# Patient Record
Sex: Male | Born: 1965 | Race: Black or African American | Hispanic: Yes | Marital: Single | Smoking: Never smoker
Health system: Southern US, Community
[De-identification: ages and names within clinical notes are randomized; demographics above are authoritative.]

## PROBLEM LIST (undated history)

## (undated) DIAGNOSIS — E119 Type 2 diabetes mellitus without complications: Secondary | ICD-10-CM

---

## 2013-04-04 ENCOUNTER — Emergency Department (HOSPITAL_COMMUNITY): Payer: No Typology Code available for payment source

## 2013-04-04 ENCOUNTER — Encounter (HOSPITAL_COMMUNITY): Payer: Self-pay | Admitting: Emergency Medicine

## 2013-04-04 ENCOUNTER — Emergency Department (HOSPITAL_COMMUNITY)
Admission: EM | Admit: 2013-04-04 | Discharge: 2013-04-04 | Disposition: A | Discharge status: Home / Self Care | Payer: No Typology Code available for payment source | Attending: Emergency Medicine | Admitting: Emergency Medicine

## 2013-04-04 DIAGNOSIS — Y9389 Activity, other specified: Secondary | ICD-10-CM | POA: Insufficient documentation

## 2013-04-04 DIAGNOSIS — F410 Panic disorder [episodic paroxysmal anxiety] without agoraphobia: Secondary | ICD-10-CM | POA: Insufficient documentation

## 2013-04-04 DIAGNOSIS — IMO0002 Reserved for concepts with insufficient information to code with codable children: Secondary | ICD-10-CM | POA: Insufficient documentation

## 2013-04-04 DIAGNOSIS — R0602 Shortness of breath: Secondary | ICD-10-CM | POA: Insufficient documentation

## 2013-04-04 DIAGNOSIS — M5410 Radiculopathy, site unspecified: Secondary | ICD-10-CM

## 2013-04-04 DIAGNOSIS — G479 Sleep disorder, unspecified: Secondary | ICD-10-CM | POA: Insufficient documentation

## 2013-04-04 DIAGNOSIS — Y92410 Unspecified street and highway as the place of occurrence of the external cause: Secondary | ICD-10-CM | POA: Insufficient documentation

## 2013-04-04 MED ORDER — METHOCARBAMOL 500 MG PO TABS: 500.0000 mg | ORAL_TABLET | Freq: Two times a day (BID) | ORAL | Status: DC

## 2013-04-04 MED ORDER — OXYCODONE-ACETAMINOPHEN 5-325 MG PO TABS: 2.0000 | ORAL_TABLET | ORAL | Status: DC | PRN

## 2013-04-04 MED ORDER — IBUPROFEN 800 MG PO TABS: 800.0000 mg | ORAL_TABLET | Freq: Three times a day (TID) | ORAL | Status: DC

## 2013-04-04 MED ORDER — PREDNISONE 20 MG PO TABS: ORAL_TABLET | ORAL | Status: DC
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
## 2013-04-04 NOTE — Discharge Instructions (Signed)
Radiculopata lumbosacra (Lumbosacral Radiculopathy) Le han diagnosticado una radiculopata lumbosacra. Significa que un nervio est comprimido en la zona inferior de la espalda (rea lumbosacra). Cuando esto ocurre, puede sentir debilidad en las piernas y ser incapaz de mantenerse parado en puntas de pie. Puede sentir Chief Strategy Officer corre por las piernas [place]. Puede tener dificultad para caminar normalmente. Hay numerosas causas que originan este problema. Algunas veces es consecuencia de una lesin o simplemente es debido a artritis o problemas seos. Otra de las causas puede ser una enfermedad, como la diabetes. Si no mejora luego del tratamiento, deber realizarse estudios ms profundos para hallar la causa exacta. DIAGNSTICO Puede ser necesario que le tomen radiografas si los problemas persisten durante mucho tiempo. Podrn realizarle una electromiografa. En este examen se estudia el funcionamiento de los nervios y los msculos. INSTRUCCIONES PARA EL CUIDADO DOMICILIARIO  Puede aliviarlo la aplicacin de una bolsa con hielo. Coloque el hielo en una bolsa plstica y envulvala en una toalla para prevenir el congelamiento de la piel. Podr aplicarlo cada 2 horas durante 20  30 minutos, o cuando lo crea necesario, mientras se encuentre despierto, o segn se lo haya indicado el profesional que lo asiste.  Si luego de Corporate treasurer y fro observa que uno le hace mejor que el otro, contine con el que le trae [place].  Utilice los medicamentos de venta libre o de prescripcin para Chief Technology Officer, Environmental health practitioner o la [place], segn se lo indique el profesional que lo asiste.  Si le indican fisioterapia, siga las indicaciones de su mdico. SOLICITE ATENCIN MDICA DE INMEDIATO SI:  El dolor no se alivia con los [employer].  Parece empeorar ms que mejorar.  Aumenta la debilidad en las piernas.  Pierde el control de la vejiga o del intestino.  Tiene dificultad para caminar o para  mantener el equilibrio o torpeza en el uso de las piernas.  Tiene fiebre. EST SEGURO QUE:   Comprende las instrucciones para el alta mdica.  Controlar su enfermedad.  Solicitar atencin mdica de inmediato segn las indicaciones. Document Released: 12/12/2004 Document Revised: 05/27/2011 [facility] Patient Information 2014 [place], [place].   Emergency Department Resource Guide 1) Find a Doctor and Pay Out of Pocket Although you won't have to find out who is covered by your insurance plan, it is a good idea to ask around and get recommendations. You will then need to call the office and see if the doctor you have chosen will accept you as a new patient and what types of options they offer for patients who are self-pay. Some doctors offer discounts or will set up payment plans for their patients who do not have insurance, but you will need to ask so you aren't surprised when you get to your appointment.  2) Contact Your Local Health Department Not all health departments have doctors that can see patients for sick visits, but many do, so it is worth a call to see if yours does. If you don't know where your local health department is, you can check in your phone book. The CDC also has a tool to help you locate your state's health department, and many state websites also have listings of all of their local health departments.  3) Find a Walk-in Clinic If your illness is not likely to be very severe or complicated, you may want to try a walk in clinic. These are popping up all over the country in pharmacies, drugstores, and shopping centers. They're usually staffed  by nurse practitioners or physician assistants that have been trained to treat common illnesses and complaints. They're usually fairly quick and inexpensive. However, if you have serious medical issues or chronic medical problems, these are probably not your best option.  No Primary Care Doctor: - Call Health Connect at  [phone] -  they can help you locate a primary care doctor that  accepts your insurance, provides certain services, etc. - Physician Referral Service- [phone]  Chronic Pain Problems: Organization         Address  Phone   Notes  [facility]  [phone] Patients need to be referred by their primary care doctor.   Medication Assistance: Organization         Address  Phone   Notes  [facility] Medication [facility] [address] [place], [place] [address] [phone] --Must be a resident of [facility] [place] -- Must have NO insurance coverage whatsoever (no Medicaid/ Medicare, etc.) -- The pt. MUST have a primary care doctor that directs their care regularly and follows them in the community   [facility]  [phone]   [employer]  [phone]    Agencies that provide inexpensive medical care: Organization         Address  Phone   Notes  [facility]  [phone]   [facility]    [phone]   [facility] [address] [place], [place] [address] [phone]   [facility] [address], [place] [phone]) [phone]   [facility]anned [facility]    [phone]   [facility]    [phone]   [facility]  [address], [place] Phone:  [phone], Fax:  [phone] Hours of Operation:  9 am - 6 pm, M-F.  Also accepts Medicaid/Medicare and self-pay.  [facility]  [address], [place] Phone: [phone](336) [phone], Fax: [phone]. Hours of Operation:  8:30 am - 5:30 pm, M-F.  Also accepts Medicaid and self-pay.  [facility] [place] [address], [place] Phone: [phone]) [phone]   [facility] [address], [place], [place] [phone], Ext. [phone] Mondays & Thursdays: 7-9 AM.  First 15 patients are seen on a first come, first serve basis.    Medicaid-accepting  [facility] Providers:  Organization         Address  Phone   Notes  [facility] [address], [place] [phone]) [phone] Also accepts self-pay patients.  [facility] [address] [place], [place]  [phone]   [facility] [address], [place] [phone]) [phone]   [facility] [address] [place] [address], [place] [phone]) [phone]   Renaye RakersVeita Bland [address], [place]   [phone] Only accepts [place] Access [place] patients after they have their name applied to their card.   Self-Pay (no insurance) in [facility] [place]:  Organization         Address  Phone   Notes  Sickle Cell Patients, [facility] [address], [place] [phone]   [facility] [address], [place] [phone]) [phone]   [facility] [place]  [address], [place] [phone]   [facility]/Dr.  Osei-Bonsu  [address] [place] or [address], [place] [phone] Phone number for both [place] and [place] locations is the same.  [facility] [address] [place] [phone]   [facility] [address] [place] or [address] [phone]   [facility] [address], [place] [phone], phone; [phone], fax Sees patients 1st and 3rd Saturday of every month.  Must not qualify for public or private insurance (i.e. Medicaid, Medicare, [place] Health Choice, Veterans' Benefits)  Household income should be no more than 200% of the poverty level The clinic cannot treat you if you are pregnant or think you are pregnant  Sexually transmitted diseases are not treated at the clinic.    Dental Care: Organization         Address  Phone  Notes  [facility] Department of [facility] [address] [place], [place] [phone] Accepts children up to age 60 who are enrolled in [place] or [place] Health Choice; pregnant women with a Medicaid card; and children who have applied for Medicaid or [place] Health Choice, but were declined, whose parents can pay a reduced fee at time of service.  [facility] Department of [facility]  [address], [place] [phone] Accepts children up to age 48 who are enrolled in [place] or [place] Health Choice; pregnant women with a Medicaid card; and children who have applied for Medicaid or [place] Health Choice, but were declined, whose parents can pay a reduced fee at time of service.  [facility]  [address] [place], [place] [phone] Patients are seen by appointment only. Walk-ins are not accepted. [facility] will see patients 72 years of age and older. Monday - Tuesday (8am-5pm) Most Wednesdays (8:30-5pm) $30 per visit, cash only  [facility] Access PRO[facility]  [address], [facility], [place] [phone] Patients are seen by appointment only. Walk-ins are not accepted. [facility] will see patients 32 years of age and older. One Wednesday Evening (Monthly: Volunteer Based).  $30 per visit, cash only  [employer]  [phone] for adults; Children under age 25, call Graduate Pediatric Dentistry at [phone]. Children aged 57-14, please call [phone] to request a pediatric application.  Dental services are provided in all areas of dental care including fillings, crowns and bridges, complete and partial dentures, implants, gum treatment, root canals, and extractions. Preventive care is also provided. Treatment is provided to both adults and children. Patients are selected via a lottery and there is often a waiting list.   [facility] [address] [place]  [phone] [email or web address]   [facility] [address] [place], [place] [phone], Ext. [phone] Second and Fourth Thursday of each month, opens at 6:30 AM; Clinic ends at 9 AM.  Patients are seen on a first-come first-served basis, and a limited number are seen during each clinic.   [facility]  [address] [place], [place] [phone]   Eligibility Requirements You must have lived in [place], [place], or [place] counties for at least the last three months.   You cannot be eligible for state or federal sponsored [place], including [employer], [place], or [employer].   You generally cannot be eligible for healthcare insurance through your employer.  How to apply: Eligibility screenings are held every Tuesday and Wednesday afternoon from 1:00 pm until 4:00 pm. You do not need an appointment for the interview!  [facility] [address], [place], [place] [phone]   [facility] Health Department  [phone]   [facility] Health Department  [phone]   [facility] Health Department  [phone]    Behavioral Health Resources in the Community: Intensive Outpatient Programs Organization         Address  Phone  Notes  [facility] Services [address] [place], [place] [phone]   [facility] Outpatient [address] [place], [place] [phone]   [facility] [address], [place], [place]  [phone]   [facility] Mental Health [address],  [place], [place] [phone] or [phone]   Substance Abuse Resources Organization         Address  Phone  Notes  Alcohol and Drug Services  [phone]   [facility]  [phone]   [facility] [place]  [phone]   Floydene Flock  [phone]   Residential & Outpatient Substance Abuse Program  [phone]   Psychological Services Organization         Address  Phone  Notes  [facility] Health   [phone]   [facility] Services  [phone]   [facility] Mental Health [address] [place] [phone] or [phone]    Mobile Crisis Teams Organization         Address  Phone  Notes  [facility], Mobile Crisis Care Unit  [phone]   [facility]  [address] [place], [place] [phone]   Doristine Locks [address] [place] [phone]    Self-Help/Support Groups Organization         Address  Phone             Notes  [facility] [place] - variety of support groups  336- I7437963 Call for more information  Narcotics Anonymous (NA), [facility] [address], [employer] [place]  2 meetings at this location   Statistician         Address  Phone  Notes  [facility] [address],    [place]  [phone]   [facility]  [address] [place] [address], [place], [place] [phone]   [facility] [address] [place], [place] [phone] Admissions: 8am-3pm M-F  [facility] [address],    [place], [place] [phone]   [facility] [address] [place], [place], [place] [phone]   [facility] [address]  [place], [place] [phone]   [facility] - Intensive Outpatient [address], [place], [place] [phone]   [facility] (Addiction Recovery [facility] [address] [place]  [place], [place] [phone] or [phone]   [facility] ([facility]) [address], [place], [place] [phone] Accepts Medicaid  [facility] [place] [address],  [place] [phone] Substance Abuse/Addiction Treatment   [facility] Organization         Address  Phone  Notes  [facility]  9108006953   Angie Fava, PhD [address]vin Knack [place], [place]   7872514781 or  [phone]   [facility] Behavioral   [address] [place], [place] [phone]   [facility] [address], [place], [place] [phone] Insurance/Medicaid/sponsorship through [facility]  °[facility] [address]                                    [place], [place] [phone] Therapy/tele-psych/case  °[facility] [address]  ° [place], [place] [phone]    °Dr. Arfeen  [phone]   °[facility] [place]  [employer] [place] [facility] 1) [address], [place] °2) [address], [place] °3)  [address], [place] [phone] °([phone] ° °([phone]   °[place] Child Abuse Hotline (336) 342-1394 or (336) [phone] (After Hours)    ° ° ° °
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
## 2013-04-07 NOTE — ED Provider Notes (Signed)
Medical screening examination/treatment/procedure(s) were performed by non-physician practitioner and as supervising physician I was immediately available for consultation/collaboration.   Kelse Ploch L Tondalaya Perren, MD 04/07/13 2133 
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
## 2023-12-03 ENCOUNTER — Other Ambulatory Visit: Payer: Self-pay

## 2023-12-03 ENCOUNTER — Emergency Department (HOSPITAL_COMMUNITY): Payer: Self-pay

## 2023-12-03 ENCOUNTER — Inpatient Hospital Stay (HOSPITAL_COMMUNITY)
Admission: EM | Admit: 2023-12-03 | Discharge: 2023-12-06 | DRG: 064 | Disposition: A | Discharge status: Home / Self Care | Payer: Self-pay | Attending: Internal Medicine | Admitting: Internal Medicine

## 2023-12-03 ENCOUNTER — Encounter (HOSPITAL_COMMUNITY): Payer: Self-pay | Admitting: *Deleted

## 2023-12-03 DIAGNOSIS — G936 Cerebral edema: Secondary | ICD-10-CM | POA: Diagnosis present

## 2023-12-03 DIAGNOSIS — Z79899 Other long term (current) drug therapy: Secondary | ICD-10-CM

## 2023-12-03 DIAGNOSIS — I10 Essential (primary) hypertension: Secondary | ICD-10-CM | POA: Diagnosis present

## 2023-12-03 DIAGNOSIS — Z7982 Long term (current) use of aspirin: Secondary | ICD-10-CM

## 2023-12-03 DIAGNOSIS — N179 Acute kidney failure, unspecified: Secondary | ICD-10-CM | POA: Diagnosis present

## 2023-12-03 DIAGNOSIS — E785 Hyperlipidemia, unspecified: Secondary | ICD-10-CM | POA: Diagnosis present

## 2023-12-03 DIAGNOSIS — E1165 Type 2 diabetes mellitus with hyperglycemia: Secondary | ICD-10-CM | POA: Diagnosis present

## 2023-12-03 DIAGNOSIS — Z1152 Encounter for screening for COVID-19: Secondary | ICD-10-CM

## 2023-12-03 DIAGNOSIS — H5712 Ocular pain, left eye: Secondary | ICD-10-CM | POA: Diagnosis present

## 2023-12-03 DIAGNOSIS — Z603 Acculturation difficulty: Secondary | ICD-10-CM | POA: Diagnosis present

## 2023-12-03 DIAGNOSIS — I63532 Cerebral infarction due to unspecified occlusion or stenosis of left posterior cerebral artery: Principal | ICD-10-CM | POA: Diagnosis present

## 2023-12-03 DIAGNOSIS — I639 Cerebral infarction, unspecified: Principal | ICD-10-CM | POA: Diagnosis present

## 2023-12-03 DIAGNOSIS — I728 Aneurysm of other specified arteries: Secondary | ICD-10-CM | POA: Diagnosis present

## 2023-12-03 DIAGNOSIS — Z6841 Body Mass Index (BMI) 40.0 and over, adult: Secondary | ICD-10-CM

## 2023-12-03 DIAGNOSIS — H53462 Homonymous bilateral field defects, left side: Secondary | ICD-10-CM | POA: Diagnosis present

## 2023-12-03 DIAGNOSIS — E872 Acidosis, unspecified: Secondary | ICD-10-CM | POA: Diagnosis present

## 2023-12-03 DIAGNOSIS — E669 Obesity, unspecified: Secondary | ICD-10-CM | POA: Diagnosis present

## 2023-12-03 DIAGNOSIS — Z7984 Long term (current) use of oral hypoglycemic drugs: Secondary | ICD-10-CM

## 2023-12-03 DIAGNOSIS — Z8673 Personal history of transient ischemic attack (TIA), and cerebral infarction without residual deficits: Secondary | ICD-10-CM

## 2023-12-03 DIAGNOSIS — R531 Weakness: Secondary | ICD-10-CM | POA: Diagnosis present

## 2023-12-03 DIAGNOSIS — I671 Cerebral aneurysm, nonruptured: Secondary | ICD-10-CM | POA: Diagnosis present

## 2023-12-03 DIAGNOSIS — N289 Disorder of kidney and ureter, unspecified: Secondary | ICD-10-CM

## 2023-12-03 DIAGNOSIS — E66813 Obesity, class 3: Secondary | ICD-10-CM | POA: Diagnosis present

## 2023-12-03 DIAGNOSIS — H53461 Homonymous bilateral field defects, right side: Secondary | ICD-10-CM | POA: Diagnosis present

## 2023-12-03 HISTORY — DX: Type 2 diabetes mellitus without complications: E11.9

## 2023-12-03 LAB — DIFFERENTIAL
Abs Immature Granulocytes: 0.02 K/uL (ref 0.00–0.07)
Basophils Absolute: 0 K/uL (ref 0.0–0.1)
Basophils Relative: 1 %
Eosinophils Absolute: 0.2 K/uL (ref 0.0–0.5)
Eosinophils Relative: 3 %
Immature Granulocytes: 0 %
Lymphocytes Relative: 28 %
Lymphs Abs: 1.7 K/uL (ref 0.7–4.0)
Monocytes Absolute: 0.4 K/uL (ref 0.1–1.0)
Monocytes Relative: 7 %
Neutro Abs: 3.8 K/uL (ref 1.7–7.7)
Neutrophils Relative %: 61 %

## 2023-12-03 LAB — COMPREHENSIVE METABOLIC PANEL WITH GFR
ALT: 20 U/L (ref 0–44)
AST: 31 U/L (ref 15–41)
Albumin: 3.7 g/dL (ref 3.5–5.0)
Alkaline Phosphatase: 105 U/L (ref 38–126)
Anion gap: 16 — ABNORMAL HIGH (ref 5–15)
BUN: 16 mg/dL (ref 6–20)
CO2: 21 mmol/L — ABNORMAL LOW (ref 22–32)
Calcium: 9.7 mg/dL (ref 8.9–10.3)
Chloride: 100 mmol/L (ref 98–111)
Creatinine, Ser: 1.25 mg/dL — ABNORMAL HIGH (ref 0.61–1.24)
GFR, Estimated: >60 mL/min (ref >60)
Glucose, Bld: 428 mg/dL — ABNORMAL HIGH (ref 70–99)
Potassium: 4.5 mmol/L (ref 3.5–5.1)
Sodium: 137 mmol/L (ref 135–145)
Total Bilirubin: 1 mg/dL (ref 0.0–1.2)
Total Protein: 7.4 g/dL (ref 6.5–8.1)

## 2023-12-03 LAB — I-STAT CHEM 8, ED
BUN: 18 mg/dL (ref 6–20)
Calcium, Ion: 1.18 mmol/L (ref 1.15–1.40)
Chloride: 100 mmol/L (ref 98–111)
Creatinine, Ser: 1 mg/dL (ref 0.61–1.24)
Glucose, Bld: 449 mg/dL — ABNORMAL HIGH (ref 70–99)
HCT: 46 % (ref 39.0–52.0)
Hemoglobin: 15.6 g/dL (ref 13.0–17.0)
Potassium: 4.2 mmol/L (ref 3.5–5.1)
Sodium: 135 mmol/L (ref 135–145)
TCO2: 22 mmol/L (ref 22–32)

## 2023-12-03 LAB — CBC
HCT: 44.5 % (ref 39.0–52.0)
Hemoglobin: 14.3 g/dL (ref 13.0–17.0)
MCH: 26.5 pg (ref 26.0–34.0)
MCHC: 32.1 g/dL (ref 30.0–36.0)
MCV: 82.4 fL (ref 80.0–100.0)
Platelets: 260 K/uL (ref 150–400)
RBC: 5.4 MIL/uL (ref 4.22–5.81)
RDW: 14.1 % (ref 11.5–15.5)
WBC: 6.2 K/uL (ref 4.0–10.5)
nRBC: 0 % (ref 0.0–0.2)

## 2023-12-03 LAB — PROTIME-INR
INR: 1 (ref 0.8–1.2)
Prothrombin Time: 13.8 s (ref 11.4–15.2)

## 2023-12-03 LAB — APTT: aPTT: 26 s (ref 24–36)

## 2023-12-03 LAB — ETHANOL: Alcohol, Ethyl (B): <15 mg/dL (ref <15)

## 2023-12-03 NOTE — ED Triage Notes (Signed)
 Pt c/o headache, blurry vision with pain in left eye, and fatigue for past 4 days. Pain has been consistent over 4 days. Pt has hx of diabetes.

## 2023-12-03 NOTE — ED Provider Triage Note (Signed)
 Emergency Medicine Provider Triage Evaluation Note  Celestine Bougie , a 58 y.o. male  was evaluated in triage.  Pt complains of left side headache x 4 days with left side facial numbness. Left arm and leg numbness x 4 months. Blurry vision bilateral eyes. Compliant with meds for dm, htn, hld.   Review of Systems  Positive:  Negative:   Physical Exam  BP 133/85   Pulse 74   Temp 98.1 F (36.7 C)   Ht 5' 6 (1.676 m)   Wt 127 kg   SpO2 98%   BMI 45.19 kg/m  Gen:   Awake, no distress   Resp:  Normal effort  MSK:   Moves extremities without difficulty  Other:  Equal arm and leg strength, no arm drift, no facial symmetry, no sensory change to arms, legs, face  Medical Decision Making  Medically screening exam initiated at 3:21 PM.  Appropriate orders placed.  Johnhenry Leos-Coronado was informed that the remainder of the evaluation will be completed by another provider, this initial triage assessment does not replace that evaluation, and the importance of remaining in the ED until their evaluation is complete.  Interpreter used for exam and history. Out of window for code stroke.    Beverley Leita LABOR, PA-C 12/03/23 1522

## 2023-12-03 NOTE — ED Triage Notes (Signed)
 The pt is c/o a headache for several days

## 2023-12-04 ENCOUNTER — Inpatient Hospital Stay (HOSPITAL_COMMUNITY): Payer: Self-pay

## 2023-12-04 ENCOUNTER — Emergency Department (HOSPITAL_COMMUNITY): Payer: Self-pay

## 2023-12-04 DIAGNOSIS — N289 Disorder of kidney and ureter, unspecified: Secondary | ICD-10-CM

## 2023-12-04 DIAGNOSIS — I639 Cerebral infarction, unspecified: Secondary | ICD-10-CM | POA: Diagnosis present

## 2023-12-04 DIAGNOSIS — E1165 Type 2 diabetes mellitus with hyperglycemia: Secondary | ICD-10-CM

## 2023-12-04 DIAGNOSIS — I10 Essential (primary) hypertension: Secondary | ICD-10-CM

## 2023-12-04 DIAGNOSIS — E669 Obesity, unspecified: Secondary | ICD-10-CM

## 2023-12-04 DIAGNOSIS — I671 Cerebral aneurysm, nonruptured: Secondary | ICD-10-CM

## 2023-12-04 DIAGNOSIS — E785 Hyperlipidemia, unspecified: Secondary | ICD-10-CM

## 2023-12-04 DIAGNOSIS — I6389 Other cerebral infarction: Secondary | ICD-10-CM

## 2023-12-04 DIAGNOSIS — I63532 Cerebral infarction due to unspecified occlusion or stenosis of left posterior cerebral artery: Secondary | ICD-10-CM

## 2023-12-04 LAB — ECHOCARDIOGRAM COMPLETE
Area-P 1/2: 2.56 cm2
Height: 66 in
S' Lateral: 3.18 cm
Weight: 4480 [oz_av]

## 2023-12-04 LAB — RAPID URINE DRUG SCREEN, HOSP PERFORMED
Amphetamines: NOT DETECTED
Barbiturates: NOT DETECTED
Benzodiazepines: NOT DETECTED
Cocaine: NOT DETECTED
Opiates: NOT DETECTED
Tetrahydrocannabinol: NOT DETECTED

## 2023-12-04 LAB — LIPID PANEL
Cholesterol: 153 mg/dL (ref 0–200)
HDL: 49 mg/dL (ref >40)
LDL Cholesterol: 79 mg/dL (ref 0–99)
Total CHOL/HDL Ratio: 3.1 ratio
Triglycerides: 127 mg/dL (ref <150)
VLDL: 25 mg/dL (ref 0–40)

## 2023-12-04 LAB — HEMOGLOBIN A1C
Hgb A1c MFr Bld: 11.2 % — ABNORMAL HIGH (ref 4.8–5.6)
Mean Plasma Glucose: 274.74 mg/dL

## 2023-12-04 LAB — CBG MONITORING, ED
Glucose-Capillary: 170 mg/dL — ABNORMAL HIGH (ref 70–99)
Glucose-Capillary: 326 mg/dL — ABNORMAL HIGH (ref 70–99)
Glucose-Capillary: 179 mg/dL — ABNORMAL HIGH (ref 70–99)

## 2023-12-04 LAB — GLUCOSE, CAPILLARY: Glucose-Capillary: 163 mg/dL — ABNORMAL HIGH (ref 70–99)

## 2023-12-04 LAB — SARS CORONAVIRUS 2 BY RT PCR: SARS Coronavirus 2 by RT PCR: NEGATIVE

## 2023-12-04 LAB — CK: Total CK: 126 U/L (ref 49–397)

## 2023-12-04 MED ORDER — ENOXAPARIN SODIUM 40 MG/0.4ML IJ SOSY
40.0000 mg | PREFILLED_SYRINGE | INTRAMUSCULAR | Status: DC
  Administered 2023-12-04: 40 mg via SUBCUTANEOUS
  Filled 2023-12-04: qty 0.4

## 2023-12-04 MED ORDER — ACETAMINOPHEN 325 MG PO TABS
650.0000 mg | ORAL_TABLET | ORAL | Status: DC | PRN
  Administered 2023-12-04 (×2): 650 mg via ORAL
  Filled 2023-12-04 (×2): qty 2

## 2023-12-04 MED ORDER — STROKE: EARLY STAGES OF RECOVERY BOOK
Freq: Once | Status: AC
  Administered 2023-12-05: 1
  Filled 2023-12-04: qty 1

## 2023-12-04 MED ORDER — INSULIN ASPART 100 UNIT/ML IJ SOLN: 0.0000 [IU] | Freq: Every day | INTRAMUSCULAR | Status: DC

## 2023-12-04 MED ORDER — ACETAMINOPHEN 650 MG RE SUPP: 650.0000 mg | RECTAL | Status: DC | PRN

## 2023-12-04 MED ORDER — INSULIN GLARGINE 100 UNIT/ML ~~LOC~~ SOLN
15.0000 [IU] | SUBCUTANEOUS | Status: DC
  Administered 2023-12-04 – 2023-12-05 (×2): 15 [IU] via SUBCUTANEOUS
  Filled 2023-12-04 (×3): qty 0.15

## 2023-12-04 MED ORDER — INSULIN ASPART 100 UNIT/ML IJ SOLN
0.0000 [IU] | Freq: Three times a day (TID) | INTRAMUSCULAR | Status: DC
  Administered 2023-12-04: 3 [IU] via SUBCUTANEOUS
  Administered 2023-12-04 – 2023-12-05 (×2): 11 [IU] via SUBCUTANEOUS
  Administered 2023-12-05: 3 [IU] via SUBCUTANEOUS
  Administered 2023-12-05 – 2023-12-06 (×2): 5 [IU] via SUBCUTANEOUS
  Administered 2023-12-06: 3 [IU] via SUBCUTANEOUS

## 2023-12-04 MED ORDER — KETOROLAC TROMETHAMINE 15 MG/ML IJ SOLN
15.0000 mg | Freq: Once | INTRAMUSCULAR | Status: DC
  Filled 2023-12-04: qty 1

## 2023-12-04 MED ORDER — ATORVASTATIN CALCIUM 10 MG PO TABS
20.0000 mg | ORAL_TABLET | Freq: Every day | ORAL | Status: DC
Start: 2023-12-04 — End: 2023-12-04
  Administered 2023-12-04: 20 mg via ORAL
  Filled 2023-12-04: qty 2

## 2023-12-04 MED ORDER — ACETAMINOPHEN 160 MG/5ML PO SOLN: 650.0000 mg | ORAL | Status: DC | PRN

## 2023-12-04 MED ORDER — ATORVASTATIN CALCIUM 40 MG PO TABS
40.0000 mg | ORAL_TABLET | Freq: Every day | ORAL | Status: DC
  Administered 2023-12-05 – 2023-12-06 (×2): 40 mg via ORAL
  Filled 2023-12-04 (×2): qty 1

## 2023-12-04 MED ORDER — METOCLOPRAMIDE HCL 5 MG/ML IJ SOLN
10.0000 mg | Freq: Once | INTRAMUSCULAR | Status: AC
  Administered 2023-12-04: 10 mg via INTRAVENOUS
  Filled 2023-12-04: qty 2

## 2023-12-04 MED ORDER — IOHEXOL 350 MG/ML SOLN
75.0000 mL | Freq: Once | INTRAVENOUS | Status: AC | PRN
  Administered 2023-12-04: 75 mL via INTRAVENOUS

## 2023-12-04 MED ORDER — SODIUM CHLORIDE 0.9 % IV SOLN: INTRAVENOUS | Status: AC

## 2023-12-04 MED ORDER — SODIUM CHLORIDE 0.9 % IV BOLUS
1000.0000 mL | Freq: Once | INTRAVENOUS | Status: AC
  Administered 2023-12-04: 1000 mL via INTRAVENOUS

## 2023-12-04 MED ORDER — ACETAMINOPHEN 500 MG PO TABS
1000.0000 mg | ORAL_TABLET | Freq: Once | ORAL | Status: AC
  Administered 2023-12-04: 1000 mg via ORAL
  Filled 2023-12-04: qty 2

## 2023-12-04 MED ORDER — SENNOSIDES-DOCUSATE SODIUM 8.6-50 MG PO TABS: 1.0000 | ORAL_TABLET | Freq: Every evening | ORAL | Status: DC | PRN

## 2023-12-04 MED ORDER — ASPIRIN 81 MG PO TBEC
81.0000 mg | DELAYED_RELEASE_TABLET | Freq: Every day | ORAL | Status: DC
  Administered 2023-12-04 – 2023-12-06 (×3): 81 mg via ORAL
  Filled 2023-12-04 (×3): qty 1

## 2023-12-04 MED ORDER — INSULIN ASPART 100 UNIT/ML IJ SOLN
8.0000 [IU] | Freq: Once | INTRAMUSCULAR | Status: AC
  Administered 2023-12-04: 8 [IU] via SUBCUTANEOUS

## 2023-12-04 MED ORDER — CLOPIDOGREL BISULFATE 75 MG PO TABS
75.0000 mg | ORAL_TABLET | Freq: Every day | ORAL | Status: DC
  Administered 2023-12-04 – 2023-12-06 (×3): 75 mg via ORAL
  Filled 2023-12-04 (×3): qty 1

## 2023-12-04 NOTE — Progress Notes (Addendum)
 STROKE TEAM PROGRESS NOTE   SUBJECTIVE (INTERVAL HISTORY) No family is at the bedside.  Telemetry interpreter was used.  Overall his condition is stable.  He still has bilateral upper quadrantanopsia, stated that he is not driving now and his brother was driving.  Sugar not in good control, educated on risk factor modification.  Dr. Ray consulted for incidental aneurysm.   OBJECTIVE Temp:  [97.9 F (36.6 C)-98.6 F (37 C)] 97.9 F (36.6 C) (09/18 0948) Pulse Rate:  [53-74] 53 (09/18 1015) Cardiac Rhythm: Normal sinus rhythm (09/17 1523) Resp:  [10-18] 15 (09/18 1015) BP: (129-167)/(76-106) 152/81 (09/18 1015) SpO2:  [98 %-100 %] 98 % (09/18 1015) Weight:  [872 kg] 127 kg (09/17 1508)  Recent Labs  Lab 12/04/23 0800 12/04/23 1253  GLUCAP 170* 326*   Recent Labs  Lab 12/03/23 1544 12/03/23 1545  NA 137 135  K 4.5 4.2  CL 100 100  CO2 21*  --   GLUCOSE 428* 449*  BUN 16 18  CREATININE 1.25* 1.00  CALCIUM  9.7  --    Recent Labs  Lab 12/03/23 1544  AST 31  ALT 20  ALKPHOS 105  BILITOT 1.0  PROT 7.4  ALBUMIN 3.7   Recent Labs  Lab 12/03/23 1544 12/03/23 1545  WBC 6.2  --   NEUTROABS 3.8  --   HGB 14.3 15.6  HCT 44.5 46.0  MCV 82.4  --   PLT 260  --    Recent Labs  Lab 12/04/23 0522  CKTOTAL 126   Recent Labs    12/03/23 1544  LABPROT 13.8  INR 1.0   No results for input(s): COLORURINE, LABSPEC, PHURINE, GLUCOSEU, HGBUR, BILIRUBINUR, KETONESUR, PROTEINUR, UROBILINOGEN, NITRITE, LEUKOCYTESUR in the last 72 hours.  Invalid input(s): APPERANCEUR     Component Value Date/Time   CHOL 153 12/04/2023 0522   TRIG 127 12/04/2023 0522   HDL 49 12/04/2023 0522   CHOLHDL 3.1 12/04/2023 0522   VLDL 25 12/04/2023 0522   LDLCALC 79 12/04/2023 0522   Lab Results  Component Value Date   HGBA1C 11.2 (H) 12/03/2023      Component Value Date/Time   LABOPIA NONE DETECTED 12/04/2023 0230   COCAINSCRNUR NONE DETECTED 12/04/2023 0230    LABBENZ NONE DETECTED 12/04/2023 0230   AMPHETMU NONE DETECTED 12/04/2023 0230   THCU NONE DETECTED 12/04/2023 0230   LABBARB NONE DETECTED 12/04/2023 0230    Recent Labs  Lab 12/03/23 1544  ETH <15    I have personally reviewed the radiological images below and agree with the radiology interpretations.  CT ANGIO HEAD NECK W WO CM Result Date: 12/04/2023 CLINICAL DATA:  58 year old male with acute left PCA territory infarct on MRI this morning. Blurred vision, fatigue for 4 days, headache. Diabetes EXAM: CT ANGIOGRAPHY HEAD AND NECK WITH AND WITHOUT CONTRAST TECHNIQUE: Multidetector CT imaging of the head and neck was performed using the standard protocol during bolus administration of intravenous contrast. Multiplanar CT image reconstructions and MIPs were obtained to evaluate the vascular anatomy. Carotid stenosis measurements (when applicable) are obtained utilizing NASCET criteria, using the distal internal carotid diameter as the denominator. RADIATION DOSE REDUCTION: This exam was performed according to the departmental dose-optimization program which includes automated exposure control, adjustment of the mA and/or kV according to patient size and/or use of iterative reconstruction technique. CONTRAST:  75mL OMNIPAQUE  IOHEXOL  350 MG/ML SOLN COMPARISON:  Brain MRI 0236 hours today.  Head CT yesterday. FINDINGS: CT HEAD Brain: Stable non contrast CT appearance of the  brain since yesterday Hypodense Cytotoxic edema in the left occipital lobe. No acute intracranial hemorrhage or mass effect. Contralateral encephalomalacia in the right PCA territory. Basilar cisterns remain patent. Stable gray-white matter differentiation throughout the brain. Calvarium and skull base: Intact. No acute osseous abnormality identified. Paranasal sinuses: Visualized paranasal sinuses and mastoids are stable and well aerated. Orbits: No acute orbit or scalp soft tissue finding. CTA NECK Skeleton: No acute osseous  abnormality identified. Left maxillary sinus alveolar recess mucosal thickening which is in continuity with left posterior maxillary dental periapical lucency. Upper chest: Negative. Other neck: Nonvascular neck soft tissue spaces are within normal limits. Aortic arch: 3 vessel arch. Mild tortuosity of the visible distal arch, mild soft and calcified plaque there. Right carotid system: Mild brachiocephalic artery and right CCA origin tortuosity without plaque or stenosis. No significant right carotid bifurcation plaque, no stenosis to the skull base. Left carotid system: Tortuous Left CCA, ectatic measuring 8 mm in the neck. Ectatic left carotid bifurcation and proximal ICA. No bifurcation plaque or stenosis. Mild soft and calcified plaque at the ectatic left ICA bulb which is nearly 11 mm diameter (series 11, image 103). No associated stenosis. Tortuosity and ectasia of the vessel continues to the skull base. Vertebral arteries: Mild plaque of the proximal right subclavian artery which appears mildly ectatic without stenosis. Normal right vertebral artery origin. Right vertebral artery remains patent to the skull base with intermittent mild irregularity and stenosis in the V2 segment (series 9, image 235, 230. No intimal flap is identified. No high-grade stenosis. Proximal left subclavian artery is patent with minimal plaque and mild ectasia. Normal left vertebral artery origin. Left V1 paravertebral venous contrast limits detail of the vessel in that segment. Left V2 segment is patent and appears mildly dominant with occasional calcified plaque, no stenosis to the skull base. CTA HEAD Posterior circulation: Moderate abrupt tapering of the distal left vertebral artery as it crosses the dura on series 12, image 145, series 9, image 159. This is proximal to the left PICA origin which remains patent. The more distal left V4 segment caliber is normal to the vertebrobasilar junction. Contralateral right V4 segment  intermittent calcified plaque and the vessel is occluded in a short segment between the patent right PICA origin on series 9, image 152 and reconstitution on image 147. Distal right V4 may be supplied in a retrograde fashion. Patent basilar artery with mild irregularity. No basilar stenosis. SCA origins are patent. Right PCA origin is patent. Fetal left PCA origin. Right posterior communicating artery is diminutive or absent. Right PCA is patent although with up to moderate multifocal P2 segment irregularity and stenosis. Similar right P3 moderate irregularity and stenosis. See series 14, image 22 and series 16, image 17. Fetal type left PCA is patent through the P2 segment but abruptly occludes at the P2/P3 segment junction on series 16, image 23, series 14, image 23 and series 12, image 159. There is some reconstitution. Anterior circulation: Both ICA siphons are patent. The left siphon is ectatic with mild calcified plaque and no stenosis. Normal left posterior communicating artery origin. Contralateral right ICA siphon has a more normal caliber with up to moderate calcified plaque. Mild right anterior genu siphon stenosis. Patent carotid termini. Dominant left and diminutive or absent right ACA A1 segments. 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 See also series 15, image 41. Proximal  A2 segments are patent with tortuosity. Remaining ACA branches are within normal limits. Both MCA origins are patent. Bilateral MCA M1 segments and bifurcations are mildly tortuous. Bilateral MCA branches are within normal limits. Venous sinuses: Early contrast timing, major dural venous sinuses appear to be patent. Anatomic variants: Dominant left and diminutive or absent right ACA A1 segments and fetal type left PCA origin with dominant and ectatic left ICA. Left vertebral artery also dominant. Review of the  MIP images confirms the above findings IMPRESSION: 1. Abnormal intracranial CTA: - Left PCA Occlusion at the P2/P3 segment junction. Some P3 reconstitution. 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 Dominant left and diminutive or absent right A1 segments. Recommend Neuro endovascular follow-up. - short segment Occlusion of the non dominant Right Vertebral Artery V4 segment just distal to patent right PICA origin. - Mild to moderate stenosis of the more dominant Left Vertebral Artery as it crosses into the skull base. - multifocal Moderate stenoses of the right PCA. 2. Ectatic, dominant Left Carotid Artery in the neck and through the ICA siphon. Mild superimposed atherosclerosis, no left carotid stenosis. 3. Normal caliber Right Carotid. Mild to moderate Right ICA siphon calcified plaque and mild distal Right siphon stenosis. 4. Mild tortuosity and atherosclerosis at the distal aortic arch. Electronically Signed   By: VEAR Hurst M.D.   On: 12/04/2023 04:38   MR BRAIN WO CONTRAST Result Date: 12/04/2023 CLINICAL DATA:  Headache, neuro deficit Neuro deficit, acute, stroke suspected EXAM: MRI HEAD WITHOUT CONTRAST TECHNIQUE: Multiplanar, multiecho pulse sequences of the brain and surrounding structures were obtained without intravenous contrast. COMPARISON:  CT head 12/03/2023. FINDINGS: Brain: Acute left PCA territory infarcts involving the left occipital lobe and left thalamus. Edema without substantial mass effect. Remote right PCA territory infarcts. No evidence of acute hemorrhage, mass lesion, midline shift or hydrocephalus. Vascular: Major arterial flow voids are maintained at the skull base. Skull and upper cervical spine: Normal marrow signal. Sinuses/Orbits: Mild mucosal thickening.  No acute orbital findings. Other: No mastoid effusions. IMPRESSION: 1. Acute left PCA territory infarcts. 2. Remote right PCA territory infarcts. Electronically Signed   By: Gilmore GORMAN Molt M.D.    On: 12/04/2023 03:31   CT HEAD WO CONTRAST Result Date: 12/03/2023 CLINICAL DATA:  Left-sided headache 4 days with left facial numbness. Left arm and leg numbness four months. Blurry vision bilateral eyes. EXAM: CT HEAD WITHOUT CONTRAST TECHNIQUE: Contiguous axial images were obtained from the base of the skull through the vertex without intravenous contrast. RADIATION DOSE REDUCTION: This exam was performed according to the departmental dose-optimization program which includes automated exposure control, adjustment of the mA and/or kV according to patient size and/or use of iterative reconstruction technique. COMPARISON:  None Available. FINDINGS: Brain: Ventricles, cisterns and other CSF spaces are within normal. Old left occipital infarct. Larger old right PCA territory infarct. No evidence of acute infarction. No mass, mass effect or acute hemorrhage. Vascular: No hyperdense vessel or unexpected calcification. Skull: Normal. Negative for fracture or focal lesion. Sinuses/Orbits: No acute finding. Other: None. IMPRESSION: 1. No acute findings. 2. Old left occipital infarct. Larger old right PCA territory infarct. Electronically Signed   By: Toribio Agreste M.D.   On: 12/03/2023 16:28     PHYSICAL EXAM  Temp:  [97.9 F (36.6 C)-98.6 F (37 C)] 97.9 F (36.6 C) (09/18 0948) Pulse Rate:  [53-74] 53 (09/18 1015) Resp:  [10-18] 15 (09/18 1015) BP: (129-167)/(76-106) 152/81 (09/18 1015) SpO2:  [  98 %-100 %] 98 % (09/18 1015) Weight:  [872 kg] 127 kg (09/17 1508)  General - Well nourished, well developed, in no apparent distress.  Ophthalmologic - fundi not visualized due to noncooperation.  Cardiovascular - Regular rhythm and rate.  Mental Status -  Level of arousal and orientation to year, place, and person were intact, but cannot tell me months Language including expression, naming, repetition, comprehension was assessed and found intact. Fund of Knowledge was assessed and was  intact.  Cranial Nerves II - XII - II - Visual field exam showed bilateral upper quadrantanopsia III, IV, VI - Extraocular movements intact. V - Facial sensation intact bilaterally. VII - Facial movement intact bilaterally. VIII - Hearing & vestibular intact bilaterally. X - Palate elevates symmetrically. XI - Chin turning & shoulder shrug intact bilaterally. XII - Tongue protrusion intact.  Motor Strength - The patient's strength was normal in all extremities and pronator drift was absent.  Bulk was normal and fasciculations were absent.   Motor Tone - Muscle tone was assessed at the neck and appendages and was normal.  Reflexes - The patient's reflexes were symmetrical in all extremities and he had no pathological reflexes.  Sensory - Light touch, temperature/pinprick were assessed and were symmetrical.    Coordination - The patient had normal movements in the hands and feet with no ataxia or dysmetria.  Tremor was absent.  Gait and Station - deferred.   ASSESSMENT/PLAN Mr. Frank Kim is a 58 y.o. male with history of diabetes, hyperlipidemia, stroke 2 years ago admitted for headache, bilateral blurry vision with left eye pain and fatigue. No TNK given due to outside window.    Stroke:  left PCA infarct likely secondary to large vessel disease source CT head left PCA subacute infarct, right PCA chronic infarct CTA head and neck left PCA P2/P3 occlusion, some P3 reconstitution.  Right PCA multifocal stenosis.  Right V4 short segment occlusion.  Mild to moderate right siphon stenosis. MRI acute left PCA, old right PCA infarcts 2D Echo EF 60 to 65% LDL 79 HgbA1c 11.2 UDS negative Lovenox  for VTE prophylaxis No antithrombotic prior to admission, now on aspirin  81 mg daily and clopidogrel  75 mg daily.  DAPT for 3 months given left PCA occlusion and then aspirin  alone. 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 Ray consulted, recommend outpatient follow-up  Diabetes HgbA1c 11.2 goal < 7.0 Uncontrolled CBG monitoring SSI DM education and close PCP follow up  Hypertension Stable Avoid low BP Long term BP goal normotensive  Hyperlipidemia Home meds: Lipitor 20 LDL 79, goal < 70 Now on Lipitor 40 Continue statin at discharge  Other Stroke Risk Factors ETOH use, recommend no more than 2 drinks per day Obesity, Body mass index is 45.19 kg/m.   Other Active Problems   Hospital day # 0  Neurology will sign off. Please call with questions. Pt will follow up with stroke clinic NP at [facility] in about 4 weeks. Thanks for the consult.   Ary Cummins, MD PhD  Stroke Neurology 12/04/2023 1:40 PM  I discussed with Drs. Claudene and [employer]. I spent additional inpatient 30 minutes face-to-face time with the patient and family, reviewing test results, images and medication, and discussing the diagnosis, treatment plan and potential prognosis. This patient's care requiresreview of multiple databases, neurological assessment, discussion with family, other specialists and medical decision making of high complexity.      To contact Stroke Continuity provider, please refer to [email or web address]. After hours, contact General Neurology

## 2023-12-04 NOTE — H&P (Addendum)
 History and Physical    Patient: Frank Kim FMW:[id number] DOB: [birth year] DOA: 12/03/2023 DOS: the patient was seen and examined on 12/04/2023 PCP: Pcp, No  Patient coming from: Home  Chief Complaint:  Chief Complaint  Patient presents with   Headache   HPI: Frank Kim is a 58 y.o. male with medical history significant of hypertension, hyperlipidemia,  and diabetes mellitus type 2 presents with headache and blurry vision.  He has been experiencing pain in the occipital region of his head for the past five to six days, which worsens when bending forward. Although the intensity has decreased, the headache persists.  He also has blurry vision, primarily affecting the left eye, with some involvement of the right eye, and this is associated with pain.  He reports weakness on the left side of his body, which began around the same time as the headache, approximately four to five days ago.  He drinks alcohol very little, only on weekends, and does not smoke.  No recent fever, chills, nausea, vomiting, abdominal pain, or cough.  In the ED patient was noted to be afebrile with blood pressures elevated up to 167/89, and all other vital signs maintained.  Labs significant for CO2 21 BUN 16, creatinine 1.25, glucose 428, anion gap 16, and alcohol level undetectable.  CT scan of the head revealed a subacute left occipital stroke.   Neurology had been formally consulted.  Patient was out of the window for thrombolytics.  MRI revealed acute left PCA territory infarcts with remote right PCA territory infarcts.  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  Patient had been given 1 L normal saline IV fluids, Tylenol  1000 mg p.o., Reglan , 8 units of insulin , and aspirin .   Review of Systems: As mentioned in the history of present illness. All other  systems reviewed and are negative. Past Medical History:  Diagnosis Date   Diabetes mellitus without complication (HCC)    History reviewed. No pertinent surgical history. Social History:  reports that he has never smoked. He does not have any smokeless tobacco history on file. He reports current alcohol use. He reports that he does not use drugs.  No Known Allergies  History reviewed. No pertinent family history.  Prior to Admission medications   Medication Sig Start Date End Date Taking? Authorizing Provider  amLODipine (NORVASC) 5 MG tablet Take 5 mg by mouth daily. 10/24/23  Yes [provider]  atorvastatin  (LIPITOR) 20 MG tablet Take 20 mg by mouth daily. 10/24/23  Yes [provider]  JARDIANCE 10 MG TABS tablet Take 10 mg by mouth daily. 10/24/23  Yes [provider]  lisinopril-hydrochlorothiazide (ZESTORETIC) 20-25 MG tablet Take 1 tablet by mouth daily. 10/24/23  Yes [provider]  metFORMIN (GLUCOPHAGE) 1000 MG tablet Take 1,000 mg by mouth 2 (two) times daily. 10/24/23  Yes [provider]    Physical Exam: Vitals:   12/04/23 0200 12/04/23 0209 12/04/23 0211 12/04/23 0600  BP: (!) 157/106 139/84  134/78  Pulse: 61 (!) 55  62  Resp: 15 18  16   Temp:   98.3 F (36.8 C) 98 F (36.7 C)  TempSrc:   Oral Oral  SpO2: 100% 100%  99%  Weight:      Height:        Constitutional: Elderly male currently in no acute distress able to follow  commands Eyes: PERRL, visual field deficit noted of the upper quadrantas ENMT: Mucous membranes are moist. Posterior pharynx clear of any exudate or lesions.Normal dentition.  Neck: normal, supple  Respiratory: clear to auscultation bilaterally, no wheezing, no crackles. Normal respiratory effort.   Cardiovascular: Regular rate and rhythm, no murmurs / rubs / gallops. No extremity edema.  Abdomen: no tenderness, no masses palpated. No hepatosplenomegaly. Bowel sounds positive.  Musculoskeletal: no  clubbing / cyanosis. No joint deformity upper and lower extremities. Good ROM, no contractures. Normal muscle tone.  Skin: no rashes, lesions, ulcers. No induration Neurologic: CN 2-12 grossly intact.  Strength appears relatively equal in the bilateral upper extremities Psychiatric: Normal judgment and insight. Alert and oriented x 3. Normal mood.   Data Reviewed:   Reviewed labs, imaging, and pertinent records as documented.  Assessment and Plan:  CVA  Acute/subacute.  Patient presented with complaints of headache, blurry vision, and left eye pain for the last 4 days.  CT of the head revealed signs of a subacute left occipital stroke.  MRI revealed left PCA territory infarcts with remote right PCA territory infarcts.  CTA of the head and neck significant for left PCA occlusion at P2/P3 segment junction with some P3 reconstitution. - Admit to telemetry bed - Stroke order set initiated - Neuro checks - Check Hemoglobin A1c and lipid panel in a.m. 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 Dr. Ray of neurointerventional radiology was consulted and recommended outpatient follow-up in 1 month  Uncontrolled Diabetes mellitus type 2 with hyperglycemia, without long-term use of insulin  Metabolic acidosis with elevated anion gap On admission blood sugars noted to be elevated up to 428 with CO2 21 and anion gap mildly elevated at 16.  Patient had been bolused 1 L of normal saline IV fluids and given 8 units of NovoLog  insulin . - Hypoglycemic protocol - Will need to hold metformin for at least 48 hours after IV contrast to prevent lactic acidosis -  Continue Jardiance - Check urinalysis - Follow-up hemoglobin A1c -Semglee  15 units daily - CBGs before every meal with moderate SSI - Dual antiplatelet therapy for 3 months then aspirin  alone thereafter - Adjust insulin  regimen as needed - Consult diabetes educator  Renal insufficiency Creatinine noted to be 1.25 with BUN 16.  Baseline creatinine not known at this time. - Continue gentle IV fluids - Recheck BMP in a.m.  Essential hypertension Blood pressures noted to be elevated up to 167/89. - Hold home blood pressure regimen  Hyperlipidemia - Follow-up lipid panel - Goal LDL less than 70 - Atorvastatin  increased to 40  Obesity, class III BMI 45.19 kg/m   DVT prophylaxis: Lovenox  Advance Care Planning:   Code Status: Full Code   Consults: Neurology  Family Communication: Patient's brother updated over the phone  Severity of Illness: The appropriate patient status for this patient is INPATIENT. Inpatient status is judged to be reasonable and necessary in order to provide the required intensity of service to ensure the patient's safety. The patient's presenting symptoms, physical exam findings, and initial radiographic and laboratory data in the context of their chronic comorbidities is felt to place them at high risk for further clinical deterioration. Furthermore, it  is not anticipated that the patient will be medically stable for discharge from the hospital within 2 midnights of admission.   * I certify that at the point of admission it is my clinical judgment that the patient will require inpatient hospital care spanning beyond 2 midnights from the point of admission due to high intensity of service, high risk for further deterioration and high frequency of surveillance required.*  Author: Maximino DELENA Sharps, MD 12/04/2023 7:12 AM  For on call review [email or web address].

## 2023-12-04 NOTE — ED Notes (Signed)
 Rolling call given to floor.

## 2023-12-04 NOTE — ED Notes (Signed)
 Pt to MRI

## 2023-12-04 NOTE — Consult Note (Signed)
 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 Consideration for follow up imaging in approximately 6 months to evaluate aneurysm size and morphology.   HPI: Frank Kim is a 58 y.o. male with a of DM, HTN, and dyslipidemia who presents for evaluation of fatigue and blurry vision x 4 days.  CT imaging revealed a subacute left occipital infarct.  Followup CTA imaging was notable for left PCA occlusion (P2/P3 junction.  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  Patient was alert and oriented during my exam and without headache.  He answered questions appropriately.  Past Medical History:  Diagnosis Date   Diabetes mellitus without complication (HCC)     History reviewed. No pertinent surgical history.   Home Medications:  Prior to Admission medications   Medication Sig Start Date End Date Taking? Authorizing Provider  amLODipine (NORVASC) 5 MG tablet Take 5 mg by mouth daily. 10/24/23  Yes [provider]  atorvastatin  (LIPITOR) 20 MG tablet Take 20 mg by mouth daily. 10/24/23  Yes [provider]  JARDIANCE 10 MG TABS tablet Take 10 mg by mouth daily. 10/24/23  Yes [provider]  lisinopril-hydrochlorothiazide (ZESTORETIC) 20-25 MG tablet Take 1 tablet by mouth daily. 10/24/23  Yes [provider]  metFORMIN (GLUCOPHAGE) 1000 MG tablet Take 1,000 mg by mouth 2 (two) times daily. 10/24/23  Yes [provider]    Scheduled Meds:  [START ON 12/05/2023]  stroke: early stages of recovery book   Does not apply Once   aspirin  EC  81 mg Oral Daily   atorvastatin   20 mg Oral Daily   clopidogrel   75 mg Oral Daily    enoxaparin  (LOVENOX ) injection  40 mg Subcutaneous Q24H   insulin  aspart  0-15 Units Subcutaneous TID WC   insulin  aspart  0-5 Units Subcutaneous QHS   Continuous Infusions:  sodium chloride      PRN Meds: acetaminophen  **OR** acetaminophen  (TYLENOL ) oral liquid 160 mg/5 mL **OR** acetaminophen , senna-docusate  Allergies:   No Known Allergies  Social History:   Social History   Socioeconomic History   Marital status: Single    Spouse name: Not on file   Number of children: Not on file   Years of education: Not on file   Highest education level: Not on file  Occupational History   Not on file  Tobacco Use   Smoking status: Never   Smokeless tobacco: Not on file  Substance and Sexual Activity   Alcohol use: Yes    Comment: 3x/ week   Drug use: No   Sexual activity: Not on file  Other Topics Concern   Not on file  Social History Narrative   Not on file   Social Drivers of Health   Financial Resource Strain: Not on file  Food Insecurity: Not on file  Transportation Needs: Not on file  Physical Activity: Not on file  Stress: Not on file  Social Connections: Not on file  Intimate Partner Violence: Not on file    Family History:   History reviewed. No pertinent family history.   ROS:  Please see the history of present illness.   All other ROS reviewed and negative.     Physical Exam/Data: Vitals:  12/04/23 0211 12/04/23 0600 12/04/23 0948 12/04/23 1015  BP:  134/78  (!) 152/81  Pulse:  62  (!) 53  Resp:  16  15  Temp: 98.3 F (36.8 C) 98 F (36.7 C) 97.9 F (36.6 C)   TempSrc: Oral Oral Oral   SpO2:  99%  98%  Weight:      Height:       No intake or output data in the 24 hours ending 12/04/23 1219    12/03/2023    3:08 PM  Last 3 Weights  Weight (lbs) 280 lb  Weight (kg) 127.007 kg     Body mass index is 45.19 kg/m.  General:  Well nourished, well developed, in no acute distress HEENT: normal Neck: no JVD Vascular: No carotid bruits; Distal  pulses 2+ bilaterally Cardiac:  normal S1, S2; RRR; no murmur  Lungs:  clear to auscultation bilaterally, no wheezing, rhonchi or rales  Abd: soft, nontender, no hepatomegaly  Ext: no edema Musculoskeletal:  No deformities, BUE and BLE strength normal and equal Skin: warm and dry  Neuro: EOMI.  Able to count fingers. Symetric strength and motor function  Laboratory Data:  Chemistry Recent Labs  Lab 12/03/23 1544 12/03/23 1545  NA 137 135  K 4.5 4.2  CL 100 100  CO2 21*  --   GLUCOSE 428* 449*  BUN 16 18  CREATININE 1.25* 1.00  CALCIUM  9.7  --   GFRNONAA >60  --   ANIONGAP 16*  --     Recent Labs  Lab 12/03/23 1544  PROT 7.4  ALBUMIN 3.7  AST 31  ALT 20  ALKPHOS 105  BILITOT 1.0   Lipids  Recent Labs  Lab 12/04/23 0522  CHOL 153  TRIG 127  HDL 49  LDLCALC 79  CHOLHDL 3.1    Hematology Recent Labs  Lab 12/03/23 1544 12/03/23 1545  WBC 6.2  --   RBC 5.40  --   HGB 14.3 15.6  HCT 44.5 46.0  MCV 82.4  --   MCH 26.5  --   MCHC 32.1  --   RDW 14.1  --   PLT 260  --    Thyroid No results for input(s): TSH, FREET4 in the last 168 hours.  BNPNo results for input(s): BNP, PROBNP in the last 168 hours.  DDimer No results for input(s): DDIMER in the last 168 hours.  Radiology/Studies:  CT ANGIO HEAD NECK W WO CM Result Date: 12/04/2023 CLINICAL DATA:  58 year old male with acute left PCA territory infarct on MRI this morning. Blurred vision, fatigue for 4 days, headache. Diabetes EXAM: CT ANGIOGRAPHY HEAD AND NECK WITH AND WITHOUT CONTRAST TECHNIQUE: Multidetector CT imaging of the head and neck was performed using the standard protocol during bolus administration of intravenous contrast. Multiplanar CT image reconstructions and MIPs were obtained to evaluate the vascular anatomy. Carotid stenosis measurements (when applicable) are obtained utilizing NASCET criteria, using the distal internal carotid diameter as the denominator. RADIATION DOSE  REDUCTION: This exam was performed according to the departmental dose-optimization program which includes automated exposure control, adjustment of the mA and/or kV according to patient size and/or use of iterative reconstruction technique. CONTRAST:  75mL OMNIPAQUE  IOHEXOL  350 MG/ML SOLN COMPARISON:  Brain MRI 0236 hours today.  Head CT yesterday. FINDINGS: CT HEAD Brain: Stable non contrast CT appearance of the brain since yesterday Hypodense Cytotoxic edema in the left occipital lobe. No acute intracranial hemorrhage or mass effect. Contralateral encephalomalacia in the right PCA territory. Basilar  cisterns remain patent. Stable gray-white matter differentiation throughout the brain. Calvarium and skull base: Intact. No acute osseous abnormality identified. Paranasal sinuses: Visualized paranasal sinuses and mastoids are stable and well aerated. Orbits: No acute orbit or scalp soft tissue finding. CTA NECK Skeleton: No acute osseous abnormality identified. Left maxillary sinus alveolar recess mucosal thickening which is in continuity with left posterior maxillary dental periapical lucency. Upper chest: Negative. Other neck: Nonvascular neck soft tissue spaces are within normal limits. Aortic arch: 3 vessel arch. Mild tortuosity of the visible distal arch, mild soft and calcified plaque there. Right carotid system: Mild brachiocephalic artery and right CCA origin tortuosity without plaque or stenosis. No significant right carotid bifurcation plaque, no stenosis to the skull base. Left carotid system: Tortuous Left CCA, ectatic measuring 8 mm in the neck. Ectatic left carotid bifurcation and proximal ICA. No bifurcation plaque or stenosis. Mild soft and calcified plaque at the ectatic left ICA bulb which is nearly 11 mm diameter (series 11, image 103). No associated stenosis. Tortuosity and ectasia of the vessel continues to the skull base. Vertebral arteries: Mild plaque of the proximal right subclavian artery  which appears mildly ectatic without stenosis. Normal right vertebral artery origin. Right vertebral artery remains patent to the skull base with intermittent mild irregularity and stenosis in the V2 segment (series 9, image 235, 230. No intimal flap is identified. No high-grade stenosis. Proximal left subclavian artery is patent with minimal plaque and mild ectasia. Normal left vertebral artery origin. Left V1 paravertebral venous contrast limits detail of the vessel in that segment. Left V2 segment is patent and appears mildly dominant with occasional calcified plaque, no stenosis to the skull base. CTA HEAD Posterior circulation: Moderate abrupt tapering of the distal left vertebral artery as it crosses the dura on series 12, image 145, series 9, image 159. This is proximal to the left PICA origin which remains patent. The more distal left V4 segment caliber is normal to the vertebrobasilar junction. Contralateral right V4 segment intermittent calcified plaque and the vessel is occluded in a short segment between the patent right PICA origin on series 9, image 152 and reconstitution on image 147. Distal right V4 may be supplied in a retrograde fashion. Patent basilar artery with mild irregularity. No basilar stenosis. SCA origins are patent. Right PCA origin is patent. Fetal left PCA origin. Right posterior communicating artery is diminutive or absent. Right PCA is patent although with up to moderate multifocal P2 segment irregularity and stenosis. Similar right P3 moderate irregularity and stenosis. See series 14, image 22 and series 16, image 17. Fetal type left PCA is patent through the P2 segment but abruptly occludes at the P2/P3 segment junction on series 16, image 23, series 14, image 23 and series 12, image 159. There is some reconstitution. Anterior circulation: Both ICA siphons are patent. The left siphon is ectatic with mild calcified plaque and no stenosis. Normal left posterior communicating artery  origin. Contralateral right ICA siphon has a more normal caliber with up to moderate calcified plaque. Mild right anterior genu siphon stenosis. Patent carotid termini. Dominant left and diminutive or absent right ACA A1 segments. 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 See also series 15, image 41. Proximal A2 segments are patent with tortuosity. Remaining ACA branches are within normal limits. Both MCA origins are patent. Bilateral MCA M1 segments and bifurcations are mildly  tortuous. Bilateral MCA branches are within normal limits. Venous sinuses: Early contrast timing, major dural venous sinuses appear to be patent. Anatomic variants: Dominant left and diminutive or absent right ACA A1 segments and fetal type left PCA origin with dominant and ectatic left ICA. Left vertebral artery also dominant. Review of the MIP images confirms the above findings IMPRESSION: 1. Abnormal intracranial CTA: - Left PCA Occlusion at the P2/P3 segment junction. Some P3 reconstitution. 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 Dominant left and diminutive or absent right A1 segments. Recommend Neuro endovascular follow-up. - short segment Occlusion of the non dominant Right Vertebral Artery V4 segment just distal to patent right PICA origin. - Mild to moderate stenosis of the more dominant Left Vertebral Artery as it crosses into the skull base. - multifocal Moderate stenoses of the right PCA. 2. Ectatic, dominant Left Carotid Artery in the neck and through the ICA siphon. Mild superimposed atherosclerosis, no left carotid stenosis. 3. Normal caliber Right Carotid. Mild to moderate Right ICA siphon calcified plaque and mild distal Right siphon stenosis. 4. Mild tortuosity and atherosclerosis at the distal aortic arch. Electronically Signed   By: VEAR Hurst M.D.   On: 12/04/2023  04:38   MR BRAIN WO CONTRAST Result Date: 12/04/2023 CLINICAL DATA:  Headache, neuro deficit Neuro deficit, acute, stroke suspected EXAM: MRI HEAD WITHOUT CONTRAST TECHNIQUE: Multiplanar, multiecho pulse sequences of the brain and surrounding structures were obtained without intravenous contrast. COMPARISON:  CT head 12/03/2023. FINDINGS: Brain: Acute left PCA territory infarcts involving the left occipital lobe and left thalamus. Edema without substantial mass effect. Remote right PCA territory infarcts. No evidence of acute hemorrhage, mass lesion, midline shift or hydrocephalus. Vascular: Major arterial flow voids are maintained at the skull base. Skull and upper cervical spine: Normal marrow signal. Sinuses/Orbits: Mild mucosal thickening.  No acute orbital findings. Other: No mastoid effusions. IMPRESSION: 1. Acute left PCA territory infarcts. 2. Remote right PCA territory infarcts. Electronically Signed   By: Gilmore GORMAN Molt M.D.   On: 12/04/2023 03:31   CT HEAD WO CONTRAST Result Date: 12/03/2023 CLINICAL DATA:  Left-sided headache 4 days with left facial numbness. Left arm and leg numbness four months. Blurry vision bilateral eyes. EXAM: CT HEAD WITHOUT CONTRAST TECHNIQUE: Contiguous axial images were obtained from the base of the skull through the vertex without intravenous contrast. RADIATION DOSE REDUCTION: This exam was performed according to the departmental dose-optimization program which includes automated exposure control, adjustment of the mA and/or kV according to patient size and/or use of iterative reconstruction technique. COMPARISON:  None Available. FINDINGS: Brain: Ventricles, cisterns and other CSF spaces are within normal. Old left occipital infarct. Larger old right PCA territory infarct. No evidence of acute infarction. No mass, mass effect or acute hemorrhage. Vascular: No hyperdense vessel or unexpected calcification. Skull: Normal. Negative for fracture or focal lesion.  Sinuses/Orbits: No acute finding. Other: None. IMPRESSION: 1. No acute findings. 2. Old left occipital infarct. Larger old right PCA territory infarct. Electronically Signed   By: Toribio Agreste M.D.   On: 12/03/2023 16:28     I spent 40 minutes seeing this patient. During that time I reviewed their history, evaluated their symptoms, reviewed available labs, EKGs, studies, performed an exam and formulated an assessment and plan    RAY COY, MD  12/04/2023 12:19 PM

## 2023-12-04 NOTE — ED Provider Notes (Signed)
 [place] EMERGENCY DEPARTMENT AT [facility] Provider Note   CSN: [id number] Arrival date & time: 12/03/23  1458     Patient presents with: Headache   Frank Kim is a 58 y.o. male with medical history of diabetes.  The patient presents to the ED for evaluation of headache and blurred vision.  Reports that he has had headache for the last 4 days.  States he has not taken medication for this.  Denies trauma to the side.  Reports headache has been consistent since onset.  Also complaining of left-sided lower extremity, upper extremity weakness for the last 3 to 4 months.  He also endorses blurred vision but states this been ongoing for the last 3 weeks but reports this is worsened in the last 1 week.  Also complaining of dizziness but reports this also been ongoing for the last 3 to 4 months and denies any current dizziness.  Denies chest pain or shortness of breath.  Denies fevers.  He does endorse neck pain but states neck pain is an ongoing for the last 3 weeks.  He denies that he uses corrective lenses at home.  He denies any alcohol or drug use.  He denies chest pain, shortness of breath, fevers, nausea or vomiting, abdominal pain.  He denies any known sick contacts.   Headache Associated symptoms: dizziness and neck pain        Prior to Admission medications   Medication Sig Start Date End Date Taking? Authorizing Provider  ibuprofen  (ADVIL ,MOTRIN ) 800 MG tablet Take 1 tablet (800 mg total) by mouth 3 (three) times daily. 04/04/13   Nivia Colon, PA-C  methocarbamol  (ROBAXIN ) 500 MG tablet Take 1 tablet (500 mg total) by mouth 2 (two) times daily. 04/04/13   Nivia Colon, PA-C  oxyCODONE -acetaminophen  (PERCOCET) 5-325 MG per tablet Take 2 tablets by mouth every 4 (four) hours as needed. 04/04/13   Nivia Colon, PA-C  predniSONE  (DELTASONE ) 20 MG tablet 3 tabs po day one, then 2 tabs daily x 4 days 04/04/13   Nivia Colon, PA-C    Allergies: Patient has no known allergies.     Review of Systems  Eyes:  Positive for visual disturbance.  Musculoskeletal:  Positive for neck pain.  Neurological:  Positive for dizziness and headaches.  All other systems reviewed and are negative.   Updated Vital Signs BP 139/84 (BP Location: Right Arm)   Pulse (!) 55   Temp 98.3 F (36.8 C) (Oral)   Resp 18   Ht 5' 6 (1.676 m)   Wt 127 kg   SpO2 100%   BMI 45.19 kg/m   Physical Exam Vitals and nursing note reviewed.  Constitutional:      General: He is not in acute distress.    Appearance: He is well-developed.  HENT:     Head: Normocephalic and atraumatic.  Eyes:     Conjunctiva/sclera: Conjunctivae normal.  Cardiovascular:     Rate and Rhythm: Normal rate and regular rhythm.     Heart sounds: No murmur heard. Pulmonary:     Effort: Pulmonary effort is normal. No respiratory distress.     Breath sounds: Normal breath sounds.  Abdominal:     Palpations: Abdomen is soft.     Tenderness: There is no abdominal tenderness.  Musculoskeletal:        General: No swelling.     Cervical back: Neck supple.  Skin:    General: Skin is warm and dry.     Capillary Refill: Capillary  refill takes less than 2 seconds.  Neurological:     Mental Status: He is alert and oriented to person, place, and time. Mental status is at baseline.     GCS: GCS eye subscore is 4. GCS verbal subscore is 5. GCS motor subscore is 6.     Cranial Nerves: Cranial nerves 2-12 are intact. No cranial nerve deficit.     Sensory: Sensory deficit present.     Motor: Motor function is intact. No weakness.     Comments: CN III through XII intact.  Intact finger-nose, heel-to-shin.  No pronator drift.  No slurred speech.  Equal strength throughout.  Subjective numbness to left upper and lower extremity.  PERRL.  Tracks across midline.  Alert and oriented x 4.  Psychiatric:        Mood and Affect: Mood normal.     (all labs ordered are listed, but only abnormal results are displayed) Labs Reviewed   COMPREHENSIVE METABOLIC PANEL WITH GFR - Abnormal; Notable for the following components:      Result Value   CO2 21 (*)    Glucose, Bld 428 (*)    Creatinine, Ser 1.25 (*)    Anion gap 16 (*)    All other components within normal limits  I-STAT CHEM 8, ED - Abnormal; Notable for the following components:   Glucose, Bld 449 (*)    All other components within normal limits  SARS CORONAVIRUS 2 BY RT PCR  ETHANOL  PROTIME-INR  APTT  CBC  DIFFERENTIAL  RAPID URINE DRUG SCREEN, HOSP PERFORMED    EKG: EKG Interpretation Date/Time:  Wednesday December 03 2023 15:39:14 EDT Ventricular Rate:  63 PR Interval:  130 QRS Duration:  84 QT Interval:  426 QTC Calculation: 435 R Axis:   33  Text Interpretation: Normal sinus rhythm Nonspecific T wave abnormality Abnormal ECG No previous ECGs available Confirmed by Griselda Kim [phone]) on 12/04/2023 1:10:02 AM  Radiology: MR BRAIN WO CONTRAST Result Date: 12/04/2023 CLINICAL DATA:  Headache, neuro deficit Neuro deficit, acute, stroke suspected EXAM: MRI HEAD WITHOUT CONTRAST TECHNIQUE: Multiplanar, multiecho pulse sequences of the brain and surrounding structures were obtained without intravenous contrast. COMPARISON:  CT head 12/03/2023. FINDINGS: Brain: Acute left PCA territory infarcts involving the left occipital lobe and left thalamus. Edema without substantial mass effect. Remote right PCA territory infarcts. No evidence of acute hemorrhage, mass lesion, midline shift or hydrocephalus. Vascular: Major arterial flow voids are maintained at the skull base. Skull and upper cervical spine: Normal marrow signal. Sinuses/Orbits: Mild mucosal thickening.  No acute orbital findings. Other: No mastoid effusions. IMPRESSION: 1. Acute left PCA territory infarcts. 2. Remote right PCA territory infarcts. Electronically Signed   By: Gilmore GORMAN Molt M.D.   On: 12/04/2023 03:31   CT HEAD WO CONTRAST Result Date: 12/03/2023 CLINICAL DATA:  Left-sided  headache 4 days with left facial numbness. Left arm and leg numbness four months. Blurry vision bilateral eyes. EXAM: CT HEAD WITHOUT CONTRAST TECHNIQUE: Contiguous axial images were obtained from the base of the skull through the vertex without intravenous contrast. RADIATION DOSE REDUCTION: This exam was performed according to the departmental dose-optimization program which includes automated exposure control, adjustment of the mA and/or kV according to patient size and/or use of iterative reconstruction technique. COMPARISON:  None Available. FINDINGS: Brain: Ventricles, cisterns and other CSF spaces are within normal. Old left occipital infarct. Larger old right PCA territory infarct. No evidence of acute infarction. No mass, mass effect or acute hemorrhage. Vascular: No  hyperdense vessel or unexpected calcification. Skull: Normal. Negative for fracture or focal lesion. Sinuses/Orbits: No acute finding. Other: None. IMPRESSION: 1. No acute findings. 2. Old left occipital infarct. Larger old right PCA territory infarct. Electronically Signed   By: Toribio Agreste M.D.   On: 12/03/2023 16:28    Procedures   Medications Ordered in the ED  acetaminophen  (TYLENOL ) tablet 1,000 mg (1,000 mg Oral Given 12/04/23 0116)  metoCLOPramide  (REGLAN ) injection 10 mg (10 mg Intravenous Given 12/04/23 0212)       Medical Decision Making  This is a 58 year old Spanish-speaking male presenting to the ED out of concern of headache, blurred vision, left-sided weakness.  Reports the symptoms been ongoing for quite some time.  Denies history of past stroke.  On exam, the patient is hemodynamically stable.  He is afebrile and nontachycardic.  Lung sounds clear bilaterally, no hypoxia.  Abdomen soft and compressible.  Neuroexam at baseline without noted focal neurodeficits.  Labwork initiated in triage include CBC, CMP, PT/INR, APTT, UDS, viral swab, ethanol, i-STAT Chem-8, CT head.  CBC without leukocytosis, no anemia.   Metabolic panel is grossly unremarkable, anion gap 16.  Ethanol undetectable.  PT/INR, APTT WNL.  EDS negative for all.  Viral panel negative.  CT head shows no acute findings, does show old left occipital infarct, larger old right PCA territory infarct.  Discussed findings with neurologist Dr. Lindzen who advises MR without contrast of brain to further assess.  Patient MRI of brain shows acute left PCA territory infarct, remote right PCA territory infarct.  Patient will require mission to hospital for stroke workup.  Will admit to hospitalist service.  Spoke with Dr. Alfornia of [facility] hospitalist service.  She has agreed to admit patient.  She request 8 units of subcutaneous insulin , 1 L of fluid for elevated blood glucose.  This was ordered.  Patient is stable at this time.  Amenable to plan.    Final diagnoses:  Acute CVA (cerebrovascular accident) [facility])    ED Discharge Orders     None          Frank Kim 12/04/23 Frank Griselda Norris, MD 12/04/23 2249

## 2023-12-04 NOTE — ED Notes (Addendum)
 Report received

## 2023-12-04 NOTE — Consult Note (Addendum)
 NEUROLOGY CONSULT NOTE   Date of service: December 04, 2023 Patient Name: Frank Kim MRN:  [id number] DOB:  [birth year] Chief Complaint: Blurred vision and headache Requesting Provider: Griselda Norris, MD  History of Present Illness  Frank Kim is a 58 y.o. Spanish-speaking male with a PMHx of DM who presented to the ED on Wednesday afternoon for evaluation of headache, blurred vision with pain in left eye and fatigue x 4 days. CT head revealed a subacute-appearing left occipital lobe infarct. CBG was elevated in the 400's, but labs are not consistent with DKA or HHS. Neurology was consulted for further assessment.   Additional history obtained by EDP has been reviewed: Frank Kim is a 58 y.o. male with medical history of diabetes.  The patient presents to the ED for evaluation of headache and blurred vision.  Reports that he has had headache for the last 4 days.  States he has not taken medication for this.  Denies trauma to the side.  Reports headache has been consistent since onset.  Also complaining of left-sided lower extremity, upper extremity weakness for the last 3 to 4 months.  He also endorses blurred vision but states this been ongoing for the last 3 weeks but reports this is worsened in the last 1 week.  Also complaining of dizziness but reports this also been ongoing for the last 3 to 4 months and denies any current dizziness.  Denies chest pain or shortness of breath.  Denies fevers.  He does endorse neck pain but states neck pain is an ongoing for the last 3 weeks.  He denies that he uses corrective lenses at home.  He denies any alcohol or drug use.  He denies chest pain, shortness of breath, fevers, nausea or vomiting, abdominal pain.  He denies any known sick contacts.    ROS  Comprehensive ROS performed and pertinent positives documented in HPI   Past History   Past Medical History:  Diagnosis Date   Diabetes mellitus without complication (HCC)      History reviewed. No pertinent surgical history.  Family History: History reviewed. No pertinent family history.  Social History  reports that he has never smoked. He does not have any smokeless tobacco history on file. He reports current alcohol use. He reports that he does not use drugs.  No Known Allergies  Medications   Current Facility-Administered Medications:    insulin  aspart (novoLOG ) injection 8 Units, 8 Units, Subcutaneous, Once, Groce, Christopher F, PA-C   sodium chloride  0.9 % bolus 1,000 mL, 1,000 mL, Intravenous, Once, Groce, Christopher F, PA-C  Current Outpatient Medications:    ibuprofen  (ADVIL ,MOTRIN ) 800 MG tablet, Take 1 tablet (800 mg total) by mouth 3 (three) times daily., Disp: 21 tablet, Rfl: 0   methocarbamol  (ROBAXIN ) 500 MG tablet, Take 1 tablet (500 mg total) by mouth 2 (two) times daily., Disp: 20 tablet, Rfl: 0   oxyCODONE -acetaminophen  (PERCOCET) 5-325 MG per tablet, Take 2 tablets by mouth every 4 (four) hours as needed., Disp: 10 tablet, Rfl: 0   predniSONE  (DELTASONE ) 20 MG tablet, 3 tabs po day one, then 2 tabs daily x 4 days, Disp: 11 tablet, Rfl: 0  Vitals   Vitals:   12/04/23 0005 12/04/23 0145 12/04/23 0209 12/04/23 0211  BP: (!) 167/89 129/76 139/84   Pulse: (!) 58 (!) 56 (!) 55   Resp: 16 10 18    Temp:    98.3 F (36.8 C)  TempSrc:    Oral  SpO2: 100% 100% 100%  Weight:      Height:        Body mass index is 45.19 kg/m.   Physical Exam   Constitutional: Appears well-developed and well-nourished.  Psych: Affect appropriate to situation.  Eyes: No scleral injection.  HENT: No OP obstruction.  Head: Normocephalic.  Respiratory: Effort normal, non-labored breathing.    Neurologic Examination   Mental Status: Assistance of Spanish interpreter is appreciated. Drowsy, oriented x 5, thought content appropriate.  Speech fluent with intact naming and comprehension in Spanish.  Able to follow all commands without  difficulty. Cranial Nerves: II: Visual fields are constricted bilaterally in all 4 quadrants OU with preserved but low-acuity central vision. Can count fingers with some difficulty and track. PERRL  III,IV, VI: No ptosis. EOMI. No nystagmus. V: Temp sensation equal bilaterally  VII: Smile symmetric VIII: Hearing intact to voice IX,X: No hoarseness XI: Symmetric shoulder shrug XII: Midline tongue extension Motor: BUE 5/5 proximally and distally, except for 4/5 left deltoid.  BLE 5/5 proximally and distally  No pronator drift.  Tone and bulk normal x 4 Sensory: Light touch intact throughout, bilaterally without asymmetry. No extinction to DSS.  Deep Tendon Reflexes: 1+ and symmetric bilateral biceps, brachioradialis and triceps. Hypoactive patellar and achilles reflexes.  Cerebellar: No ataxia with FNF bilaterally  Gait: Deferred   Labs/Imaging/Neurodiagnostic studies   CBC:  Recent Labs  Lab 12-06-2023 1544 2023/12/06 1545  WBC 6.2  --   NEUTROABS 3.8  --   HGB 14.3 15.6  HCT 44.5 46.0  MCV 82.4  --   PLT 260  --    Basic Metabolic Panel:  Lab Results  Component Value Date   NA 135 Dec 06, 2023   K 4.2 2023-12-06   CO2 21 (L) 06-Dec-2023   GLUCOSE 449 (H) 12/06/2023   BUN 18 12/06/23   CREATININE 1.00 2023/12/06   CALCIUM  9.7 12/06/2023   GFRNONAA >60 12/06/2023   Lipid Panel: No results found for: LDLCALC HgbA1c: No results found for: HGBA1C Urine Drug Screen:     Component Value Date/Time   LABOPIA NONE DETECTED 12/04/2023 0230   COCAINSCRNUR NONE DETECTED 12/04/2023 0230   LABBENZ NONE DETECTED 12/04/2023 0230   AMPHETMU NONE DETECTED 12/04/2023 0230   THCU NONE DETECTED 12/04/2023 0230   LABBARB NONE DETECTED 12/04/2023 0230    Alcohol Level     Component Value Date/Time   ETH <15 Dec 06, 2023 1544   INR  Lab Results  Component Value Date   INR 1.0 December 06, 2023   APTT  Lab Results  Component Value Date   APTT 26 12-06-2023     ASSESSMENT  58  y.o. Spanish-speaking male with a PMHx of DM who presented to the ED on Wednesday afternoon for evaluation of headache, blurred vision with pain in left eye and fatigue x 4 days. CT head revealed a subacute-appearing left occipital lobe infarct. CBG was elevated in the 400's, but labs are not consistent with DKA or HHS. Neurology was consulted for further assessment.  - Exam reveals visual field constriction and decreased central visual acuity OU. Left deltoid with 4/5 strength, otherwise 5/5 x 4. No sensory deficit.  - CT head (personally reviewed): Subacute-appearing left occipital lobe infarct. Larger old right PCA territory/occipital lobe infarct. - MRI brain: Early subacute left PCA territory infarcts involving the left occipital lobe and left thalamus. Edema without substantial mass effect. Remote right PCA territory infarcts.  - EKG: Normal sinus rhythm; Nonspecific T wave abnormality - Labs: - UDS and EtOH negative.  -  Glucose 449 - Coags normal - CBC normal - BUN 16, Cr 1.25, eGFR > 60.  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  - Hyperglycemia  RECOMMENDATIONS  - Admit for stroke work up - CTA of head and neck - TTE - Cardiac telemetry - HgbA1c, fasting lipid panel - PT consult, OT consult, Speech consult - Start ASA 81 mg po every day, first dose now. - Start atorvastatin  40 mg po every day.  - Baseline CK level has been ordered.  - Glycemic control - IVF - Do not restart the prednisone , which is on his home medications list in Epic. The patient is unable to clarify whether or not he is currently still taking it. It looks like he was prescribed it in 2015 EDP could not find any recent prescriptions for prednisone    - Risk factor modification to include EtOH cessation - Frequent neuro checks - NPO until passes stroke swallow screen - Stroke Team to follow in the  morning ______________________________________________________________________  I personally spent a total of 50 minutes in the care of the patient today including getting/reviewing separately obtained history, placing orders, documenting clinical information in the EHR, and independently interpreting results.   SignedMERRIANNE Mayerli Kirst, MD [facility]

## 2023-12-05 LAB — BASIC METABOLIC PANEL WITH GFR
Anion gap: 10 (ref 5–15)
BUN: 15 mg/dL (ref 6–20)
CO2: 25 mmol/L (ref 22–32)
Calcium: 8.9 mg/dL (ref 8.9–10.3)
Chloride: 101 mmol/L (ref 98–111)
Creatinine, Ser: 0.86 mg/dL (ref 0.61–1.24)
GFR, Estimated: >60 mL/min (ref >60)
Glucose, Bld: 192 mg/dL — ABNORMAL HIGH (ref 70–99)
Potassium: 3.6 mmol/L (ref 3.5–5.1)
Sodium: 136 mmol/L (ref 135–145)

## 2023-12-05 LAB — GLUCOSE, CAPILLARY
Glucose-Capillary: 302 mg/dL — ABNORMAL HIGH (ref 70–99)
Glucose-Capillary: 219 mg/dL — ABNORMAL HIGH (ref 70–99)
Glucose-Capillary: 153 mg/dL — ABNORMAL HIGH (ref 70–99)
Glucose-Capillary: 167 mg/dL — ABNORMAL HIGH (ref 70–99)

## 2023-12-05 LAB — CBC
HCT: 42.7 % (ref 39.0–52.0)
Hemoglobin: 13.7 g/dL (ref 13.0–17.0)
MCH: 26.4 pg (ref 26.0–34.0)
MCHC: 32.1 g/dL (ref 30.0–36.0)
MCV: 82.4 fL (ref 80.0–100.0)
Platelets: 228 10*3/uL (ref 150–400)
RBC: 5.18 MIL/uL (ref 4.22–5.81)
RDW: 14.1 % (ref 11.5–15.5)
WBC: 6 10*3/uL (ref 4.0–10.5)
nRBC: 0 % (ref 0.0–0.2)

## 2023-12-05 LAB — HIV ANTIBODY (ROUTINE TESTING W REFLEX): HIV Screen 4th Generation wRfx: NONREACTIVE

## 2023-12-05 MED ORDER — INSULIN STARTER KIT- PEN NEEDLES (ENGLISH)
1.0000 | Freq: Once | Status: AC
Start: 2023-12-05 — End: 2023-12-05
  Administered 2023-12-05: 1
  Filled 2023-12-05: qty 1

## 2023-12-05 MED ORDER — NAPHAZOLINE-GLYCERIN 0.012-0.25 % OP SOLN: 1.0000 [drp] | Freq: Four times a day (QID) | OPHTHALMIC | Status: DC | PRN

## 2023-12-05 MED ORDER — ENOXAPARIN SODIUM 60 MG/0.6ML IJ SOSY
60.0000 mg | PREFILLED_SYRINGE | INTRAMUSCULAR | Status: DC
  Administered 2023-12-05: 60 mg via SUBCUTANEOUS
  Filled 2023-12-05: qty 0.6

## 2023-12-05 MED ORDER — LIVING WELL WITH DIABETES BOOK - IN SPANISH
Freq: Once | Status: AC
  Filled 2023-12-05: qty 1

## 2023-12-05 NOTE — Progress Notes (Signed)
  Inpatient Rehab Admissions Coordinator :  Per therapy recommendations, patient was screened for CIR candidacy by Heron Leavell RN MSN. Discussed with therapy.  Patient 200 feet cga with no physical assist required.  Vision issues noted. Brothers work and he does not have 24/7 supervision per therapy. Recommend other rehab venues to be pursued as not in need of AIR level rehab at this time. Please call me with any questions.  Heron Leavell RN MSN Admissions Coordinator [phone]

## 2023-12-05 NOTE — TOC Initial Note (Signed)
 Transition of Care [facility]) - Initial/Assessment Note    Patient Details  Name: Frank Kim MRN: [id number] Date of Birth: 06-Aug-1965  Transition of Care [facility]) CM/SW Contact:    Andrez JULIANNA George, RN Phone Number: 12/05/2023, 2:48 PM  Clinical Narrative:                  Pt is from home with his brother. His brother is currently not working so is IT trainer. Pt gave permission for CM to reach out to his brother, Gladis. Gladis says he is able to provide the support pt will need at home.  Current recommendations are for CIR. CIR has declined. Outpatient set up with [facility]. Information on the AVS. Pt/ brother will call to schedule the first appointment. PCP: appt made with [facility] med for Oct 31st. Information on the AVS.  Brother can provide needed transportation and assist with meds at home.   IP Care management following.  Expected Discharge Plan:  (TBD) Barriers to Discharge: Inadequate or no insurance, Continued Medical Work up   Patient Goals and CMS Choice     Choice offered to / list presented to : Patient      Expected Discharge Plan and Services   Discharge Planning Services: CM Consult   Living arrangements for the past 2 months: Single Family Home                                      Prior Living Arrangements/Services Living arrangements for the past 2 months: Single Family Home Lives with:: Siblings Patient language and need for interpreter reviewed:: Yes Do you feel safe going back to the place where you live?: Yes        Care giver support system in place?: Yes (comment)   Criminal Activity/Legal Involvement Pertinent to Current Situation/Hospitalization: No - Comment as needed  Activities of Daily Living   ADL Screening (condition at time of admission) Independently performs ADLs?: Yes (appropriate for developmental age) Is the patient deaf or have difficulty hearing?: No Does the patient have difficulty  seeing, even when wearing glasses/contacts?: No Does the patient have difficulty concentrating, remembering, or making decisions?: No  Permission Sought/Granted                  Emotional Assessment Appearance:: Appears stated age Attitude/Demeanor/Rapport: Engaged Affect (typically observed): Accepting Orientation: : Oriented to Self, Oriented to Place, Oriented to Situation   Psych Involvement: No (comment)  Admission diagnosis:  CVA (cerebral vascular accident) (HCC) [I63.9] Acute CVA (cerebrovascular accident) [facility]) [I63.9] Patient Active Problem List   Diagnosis Date Noted   CVA (cerebral vascular accident) (HCC) 12/04/2023   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 [place] ([place]) [place] [address] Phone: [phone] Fax: [phone]     Social Drivers of Health (SDOH) Social History: SDOH Screenings   Food Insecurity: No Food Insecurity (12/04/2023)  Housing: High Risk (12/04/2023)  Transportation Needs: Unmet Transportation Needs (12/04/2023)  Utilities: Not At Risk (12/04/2023)  Tobacco Use: Unknown (12/03/2023)   SDOH Interventions:     Readmission Risk Interventions  No data to display

## 2023-12-05 NOTE — Progress Notes (Signed)
 [facility]                                                                               Frank Kim, is a 58 y.o. male, DOB - 01-09-1966, FMW:[id number] Admit date - 12/03/2023    Outpatient Primary MD for the patient is Pcp, No  LOS - 1  days    Brief summary   Frank Kim is a 58 y.o. male with medical history significant of hypertension, hyperlipidemia,  and diabetes mellitus type 2 presents with headache and blurry vision.   He has been experiencing pain in the occipital region of his head for the past five to six days, which worsens when bending forward. He also has blurry vision, primarily affecting the left eye, with some involvement of the right eye, and this is associated with pain.   He reports weakness on the left side of his body, which began around the same time as the headache, approximately four to five days ago.  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  Neurology on  board.  Recommended aspirin  and plavix  for 3 months followed by aspirin  alone.  LDL is 79, added statin. A1c is 11.2 Risk factor modification.  Therapy evaluations recommending CIR/SNF.  Toc aware.  UD S negative.     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    Type 2 DM uncontrolled with hyperglycemia CBG (last 3)  Recent Labs    12/04/23 2122 12/05/23 0608 12/05/23 1220  GLUCAP 163* 302* 219*   Resume SSI.  Added lantus  15 units daily, A1c is 11.2    Hypertension.  Suboptimal.   Restart lisinopril .   Mild AKI Resolved.       Estimated body mass index is 45.19 kg/m as calculated from the following:   Height as of this encounter: 5' 6 (1.676 m).   Weight as of this encounter: 127 kg.  Code Status: FULL CODE.  DVT Prophylaxis:     Level of Care: Level of care: Telemetry Medical Family Communication: NONE AT BEDSIDE.   Disposition Plan:     Remains inpatient appropriate:  pending SNF   Procedures:  Echocardiogram.   Consultants:   Neurology Intervention radiology.   Antimicrobials:   Anti-infectives (From admission, onward)    None        Medications  Scheduled Meds:  aspirin  EC  81 mg Oral Daily   atorvastatin   40 mg Oral Daily   clopidogrel   75 mg Oral Daily   enoxaparin  (LOVENOX ) injection  60 mg Subcutaneous Q24H   insulin  aspart  0-15 Units Subcutaneous TID WC   insulin  aspart  0-5 Units Subcutaneous QHS   insulin  glargine  15 Units Subcutaneous Q24H   Continuous Infusions: PRN Meds:.acetaminophen  **OR** acetaminophen  (TYLENOL ) oral liquid 160 mg/5 mL **OR** acetaminophen , senna-docusate    Subjective:   Keghan Kim was seen and examined today.  Symptoms slowly improving.   Objective:   Vitals:   12/05/23 0013 12/05/23 0421 12/05/23 0756 12/05/23 1220  BP: (!) 164/98 (!) 147/100 (!) 154/93 (!) 146/99  Pulse: (!) 57 (!) 52 (!) 52 (!) 58  Resp:   17 19  Temp: 97.8 F (36.6 C) 97.8 F (36.6 C) 98.2 F (36.8 C) 98.7 F (37.1 C)  TempSrc: Oral Oral Oral Oral  SpO2: 97% 99% 98% 100%  Weight:      Height:        Intake/Output Summary (Last 24 hours) at 12/05/2023 1426 Last data filed at 12/05/2023 0800 Gross per 24 hour  Intake 1246.73 ml  Output 1200 ml  Net 46.73 ml   Filed Weights   12/03/23 1508  Weight: 127 kg     Exam General exam: Appears calm and comfortable  Respiratory system: Clear to auscultation. Respiratory effort normal. Cardiovascular system: S1 & S2 heard, RRR. No JVD,   Gastrointestinal system: Abdomen is nondistended, soft and nontender.  Central nervous system: Alert and oriented. Vision abnormalities improving.  Extremities: no pedal edema.  Skin: No rashes, lesions or ulcers Psychiatry: Mood & affect appropriate.     Data Reviewed:  I have personally reviewed following labs and imaging studies   CBC Lab Results  Component Value Date   WBC 6.0 12/05/2023   RBC 5.18 12/05/2023   HGB 13.7 12/05/2023   HCT 42.7 12/05/2023   MCV 82.4 12/05/2023   MCH 26.4 12/05/2023   PLT 228 12/05/2023   MCHC 32.1 12/05/2023   RDW 14.1 12/05/2023   LYMPHSABS 1.7 12/03/2023   MONOABS 0.4 12/03/2023   EOSABS 0.2 12/03/2023   BASOSABS 0.0 12/03/2023     Last metabolic panel Lab Results  Component Value Date   NA 136 12/05/2023   K 3.6 12/05/2023   CL 101 12/05/2023   CO2 25 12/05/2023   BUN 15 12/05/2023   CREATININE 0.86 12/05/2023   GLUCOSE 192 (H) 12/05/2023   GFRNONAA >60 12/05/2023   CALCIUM  8.9 12/05/2023   PROT 7.4 12/03/2023   ALBUMIN 3.7 12/03/2023   BILITOT 1.0 12/03/2023   ALKPHOS 105 12/03/2023   AST 31 12/03/2023   ALT 20 12/03/2023   ANIONGAP 10 12/05/2023    CBG (last 3)  Recent Labs    12/04/23 2122 12/05/23 0608 12/05/23 1220  GLUCAP 163* 302* 219*      Coagulation Profile: Recent Labs  Lab 12/03/23 1544  INR 1.0     Radiology Studies: ECHOCARDIOGRAM COMPLETE Result Date: 12/04/2023    ECHOCARDIOGRAM REPORT   Patient Name:   Frank Kim Date of Exam: 12/04/2023 Medical Rec #:  [id number]            Height:       66.0 in Accession #:    [id number]           Weight:       280.0 lb Date of Birth:  15-Nov-1965            BSA:          2.307 m Patient Age:    58 years             BP:           143/97 mmHg Patient Gender: M  HR:           48 bpm. Exam Location:  Inpatient Procedure: 2D Echo, 3D Echo, Cardiac Doppler, Color Doppler and Strain Analysis            (Both Spectral and Color Flow  Doppler were utilized during            procedure). Indications:    Stroke  History:        Patient has no prior history of Echocardiogram examinations.  Sonographer:    Philomena Daring Referring Phys: [id number] RONDELL A SMITH  Sonographer Comments: Global longitudinal strain was attempted. IMPRESSIONS  1. Left ventricular ejection fraction, by estimation, is 60 to 65%. Left ventricular ejection fraction by 3D volume is 65 %. The left ventricle has normal function. The left ventricle has no regional wall motion abnormalities. There is mild concentric left ventricular hypertrophy. Left ventricular diastolic parameters are consistent with Grade I diastolic dysfunction (impaired relaxation). The average left ventricular global longitudinal strain is -13.1 %. The global longitudinal strain is abnormal.  2. Right ventricular systolic function is normal. The right ventricular size is normal. There is normal pulmonary artery systolic pressure.  3. The mitral valve is normal in structure. Trivial mitral valve regurgitation. No evidence of mitral stenosis.  4. The aortic valve is tricuspid. Aortic valve regurgitation is mild. No aortic stenosis is present.  5. The inferior vena cava is normal in size with greater than 50% respiratory variability, suggesting right atrial pressure of 3 mmHg. FINDINGS  Left Ventricle: Left ventricular ejection fraction, by estimation, is 60 to 65%. Left ventricular ejection fraction by 3D volume is 65 %. The left ventricle has normal function. The left ventricle has no regional wall motion abnormalities. The average left ventricular global longitudinal strain is -13.1 %. Strain was performed and the global longitudinal strain is abnormal. The left ventricular internal cavity size was normal in size. There is mild concentric left ventricular hypertrophy. Left ventricular diastolic parameters are consistent with Grade I diastolic dysfunction (impaired relaxation). Indeterminate filling pressures.  Right Ventricle: The right ventricular size is normal. No increase in right ventricular wall thickness. Right ventricular systolic function is normal. There is normal pulmonary artery systolic pressure. The tricuspid regurgitant velocity is 1.30 m/s, and  with an assumed right atrial pressure of 3 mmHg, the estimated right ventricular systolic pressure is 9.8 mmHg. Left Atrium: Left atrial size was normal in size. Right Atrium: Right atrial size was normal in size. Pericardium: There is no evidence of pericardial effusion. Mitral Valve: The mitral valve is normal in structure. Trivial mitral valve regurgitation. No evidence of mitral valve stenosis. Tricuspid Valve: The tricuspid valve is normal in structure. Tricuspid valve regurgitation is not demonstrated. No evidence of tricuspid stenosis. Aortic Valve: The aortic valve is tricuspid. Aortic valve regurgitation is mild. No aortic stenosis is present. Pulmonic Valve: The pulmonic valve was normal in structure. Pulmonic valve regurgitation is not visualized. No evidence of pulmonic stenosis. Aorta: The aortic root is normal in size and structure. Venous: The inferior vena cava is normal in size with greater than 50% respiratory variability, suggesting right atrial pressure of 3 mmHg. IAS/Shunts: No atrial level shunt detected by color flow Doppler. Additional Comments: 3D was performed not requiring image post processing on an independent workstation and was normal.  LEFT VENTRICLE PLAX 2D LVIDd:         4.87 cm         Diastology LVIDs:  3.18 cm         LV e' medial:    5.77 cm/s LV PW:         1.32 cm         LV E/e' medial:  10.5 LV IVS:        1.29 cm         LV e' lateral:   9.14 cm/s LVOT diam:     1.95 cm         LV E/e' lateral: 6.7 LV SV:         62 LV SV Index:   27              2D Longitudinal LVOT Area:     2.99 cm        Strain                                2D Strain GLS   -13.5 %                                (A4C):                                 2D Strain GLS   -12.7 %                                (A3C):                                2D Strain GLS   -13.0 %                                (A2C):                                2D Strain GLS   -13.1 %                                Avg:                                 3D Volume EF                                LV 3D EF:    Left                                             ventricul                                             ar  ejection                                             fraction                                             by 3D                                             volume is                                             65 %.                                 3D Volume EF:                                3D EF:        65 %                                LV EDV:       156 ml                                LV ESV:       54 ml                                LV SV:        102 ml RIGHT VENTRICLE             IVC RV S prime:     14.70 cm/s  IVC diam: 1.21 cm TAPSE (M-mode): 2.1 cm LEFT ATRIUM             Index        RIGHT ATRIUM           Index LA diam:        3.47 cm 1.50 cm/m   RA Area:     14.00 cm LA Vol (A2C):   62.7 ml 27.18 ml/m  RA Volume:   31.00 ml  13.44 ml/m LA Vol (A4C):   44.3 ml 19.20 ml/m LA Biplane Vol: 53.3 ml 23.10 ml/m  AORTIC VALVE LVOT Vmax:   110.00 cm/s LVOT Vmean:  73.400 cm/s LVOT VTI:    0.208 m  AORTA Ao Root diam: 3.02 cm Ao Asc diam:  3.31 cm MITRAL VALVE               TRICUSPID VALVE MV Area (PHT): 2.56 cm    TR Peak grad:   6.8 mmHg MV Decel Time: 296 msec    TR Vmax:        130.00 cm/s MV E velocity:  60.80 cm/s MV A velocity: 83.30 cm/s  SHUNTS MV E/A ratio:  0.73        Systemic VTI:  0.21 m                            Systemic Diam: 1.95 cm Annabella Scarce MD Electronically signed by Annabella Scarce MD Signature Date/Time: 12/04/2023/2:28:59 PM    Final    CT ANGIO HEAD NECK W WO CM Result Date:  12/04/2023 CLINICAL DATA:  58 year old male with acute left PCA territory infarct on MRI this morning. Blurred vision, fatigue for 4 days, headache. Diabetes EXAM: CT ANGIOGRAPHY HEAD AND NECK WITH AND WITHOUT CONTRAST TECHNIQUE: Multidetector CT imaging of the head and neck was performed using the standard protocol during bolus administration of intravenous contrast. Multiplanar CT image reconstructions and MIPs were obtained to evaluate the vascular anatomy. Carotid stenosis measurements (when applicable) are obtained utilizing NASCET criteria, using the distal internal carotid diameter as the denominator. RADIATION DOSE REDUCTION: This exam was performed according to the departmental dose-optimization program which includes automated exposure control, adjustment of the mA and/or kV according to patient size and/or use of iterative reconstruction technique. CONTRAST:  75mL OMNIPAQUE  IOHEXOL  350 MG/ML SOLN COMPARISON:  Brain MRI 0236 hours today.  Head CT yesterday. FINDINGS: CT HEAD Brain: Stable non contrast CT appearance of the brain since yesterday Hypodense Cytotoxic edema in the left occipital lobe. No acute intracranial hemorrhage or mass effect. Contralateral encephalomalacia in the right PCA territory. Basilar cisterns remain patent. Stable gray-white matter differentiation throughout the brain. Calvarium and skull base: Intact. No acute osseous abnormality identified. Paranasal sinuses: Visualized paranasal sinuses and mastoids are stable and well aerated. Orbits: No acute orbit or scalp soft tissue finding. CTA NECK Skeleton: No acute osseous abnormality identified. Left maxillary sinus alveolar recess mucosal thickening which is in continuity with left posterior maxillary dental periapical lucency. Upper chest: Negative. Other neck: Nonvascular neck soft tissue spaces are within normal limits. Aortic arch: 3 vessel arch. Mild tortuosity of the visible distal arch, mild soft and calcified plaque there.  Right carotid system: Mild brachiocephalic artery and right CCA origin tortuosity without plaque or stenosis. No significant right carotid bifurcation plaque, no stenosis to the skull base. Left carotid system: Tortuous Left CCA, ectatic measuring 8 mm in the neck. Ectatic left carotid bifurcation and proximal ICA. No bifurcation plaque or stenosis. Mild soft and calcified plaque at the ectatic left ICA bulb which is nearly 11 mm diameter (series 11, image 103). No associated stenosis. Tortuosity and ectasia of the vessel continues to the skull base. Vertebral arteries: Mild plaque of the proximal right subclavian artery which appears mildly ectatic without stenosis. Normal right vertebral artery origin. Right vertebral artery remains patent to the skull base with intermittent mild irregularity and stenosis in the V2 segment (series 9, image 235, 230. No intimal flap is identified. No high-grade stenosis. Proximal left subclavian artery is patent with minimal plaque and mild ectasia. Normal left vertebral artery origin. Left V1 paravertebral venous contrast limits detail of the vessel in that segment. Left V2 segment is patent and appears mildly dominant with occasional calcified plaque, no stenosis to the skull base. CTA HEAD Posterior circulation: Moderate abrupt tapering of the distal left vertebral artery as it crosses the dura on series 12, image 145, series 9, image 159. This is proximal to the left PICA origin which remains patent. The more distal left V4 segment caliber is normal  to the vertebrobasilar junction. Contralateral right V4 segment intermittent calcified plaque and the vessel is occluded in a short segment between the patent right PICA origin on series 9, image 152 and reconstitution on image 147. Distal right V4 may be supplied in a retrograde fashion. Patent basilar artery with mild irregularity. No basilar stenosis. SCA origins are patent. Right PCA origin is patent. Fetal left PCA origin.  Right posterior communicating artery is diminutive or absent. Right PCA is patent although with up to moderate multifocal P2 segment irregularity and stenosis. Similar right P3 moderate irregularity and stenosis. See series 14, image 22 and series 16, image 17. Fetal type left PCA is patent through the P2 segment but abruptly occludes at the P2/P3 segment junction on series 16, image 23, series 14, image 23 and series 12, image 159. There is some reconstitution. Anterior circulation: Both ICA siphons are patent. The left siphon is ectatic with mild calcified plaque and no stenosis. Normal left posterior communicating artery origin. Contralateral right ICA siphon has a more normal caliber with up to moderate calcified plaque. Mild right anterior genu siphon stenosis. Patent carotid termini. Dominant left and diminutive or absent right ACA A1 segments. 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 See also series 15, image 41. Proximal A2 segments are patent with tortuosity. Remaining ACA branches are within normal limits. Both MCA origins are patent. Bilateral MCA M1 segments and bifurcations are mildly tortuous. Bilateral MCA branches are within normal limits. Venous sinuses: Early contrast timing, major dural venous sinuses appear to be patent. Anatomic variants: Dominant left and diminutive or absent right ACA A1 segments and fetal type left PCA origin with dominant and ectatic left ICA. Left vertebral artery also dominant. Review of the MIP images confirms the above findings IMPRESSION: 1. Abnormal intracranial CTA: - Left PCA Occlusion at the P2/P3 segment junction. Some P3 reconstitution. 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 Dominant left and diminutive or absent right A1 segments. Recommend Neuro endovascular follow-up. - short segment Occlusion of the non  dominant Right Vertebral Artery V4 segment just distal to patent right PICA origin. - Mild to moderate stenosis of the more dominant Left Vertebral Artery as it crosses into the skull base. - multifocal Moderate stenoses of the right PCA. 2. Ectatic, dominant Left Carotid Artery in the neck and through the ICA siphon. Mild superimposed atherosclerosis, no left carotid stenosis. 3. Normal caliber Right Carotid. Mild to moderate Right ICA siphon calcified plaque and mild distal Right siphon stenosis. 4. Mild tortuosity and atherosclerosis at the distal aortic arch. Electronically Signed   By: VEAR Hurst M.D.   On: 12/04/2023 04:38   MR BRAIN WO CONTRAST Result Date: 12/04/2023 CLINICAL DATA:  Headache, neuro deficit Neuro deficit, acute, stroke suspected EXAM: MRI HEAD WITHOUT CONTRAST TECHNIQUE: Multiplanar, multiecho pulse sequences of the brain and surrounding structures were obtained without intravenous contrast. COMPARISON:  CT head 12/03/2023. FINDINGS: Brain: Acute left PCA territory infarcts involving the left occipital lobe and left thalamus. Edema without substantial mass effect. Remote right PCA territory infarcts. No evidence of acute hemorrhage, mass lesion, midline shift or hydrocephalus. Vascular: Major arterial flow voids are maintained at the skull base. Skull and upper cervical spine: Normal marrow signal. Sinuses/Orbits: Mild mucosal thickening.  No acute orbital findings. Other: No mastoid effusions. IMPRESSION: 1. Acute left PCA territory infarcts. 2.  Remote right PCA territory infarcts. Electronically Signed   By: Gilmore GORMAN Molt M.D.   On: 12/04/2023 03:31   CT HEAD WO CONTRAST Result Date: 12/03/2023 CLINICAL DATA:  Left-sided headache 4 days with left facial numbness. Left arm and leg numbness four months. Blurry vision bilateral eyes. EXAM: CT HEAD WITHOUT CONTRAST TECHNIQUE: Contiguous axial images were obtained from the base of the skull through the vertex without intravenous contrast.  RADIATION DOSE REDUCTION: This exam was performed according to the departmental dose-optimization program which includes automated exposure control, adjustment of the mA and/or kV according to patient size and/or use of iterative reconstruction technique. COMPARISON:  None Available. FINDINGS: Brain: Ventricles, cisterns and other CSF spaces are within normal. Old left occipital infarct. Larger old right PCA territory infarct. No evidence of acute infarction. No mass, mass effect or acute hemorrhage. Vascular: No hyperdense vessel or unexpected calcification. Skull: Normal. Negative for fracture or focal lesion. Sinuses/Orbits: No acute finding. Other: None. IMPRESSION: 1. No acute findings. 2. Old left occipital infarct. Larger old right PCA territory infarct. Electronically Signed   By: Toribio Agreste M.D.   On: 12/03/2023 16:28       Elgie Butter M.D. [facility] 12/05/2023, 2:26 PM  Available via Epic secure chat 7am-7pm After 7 pm, please refer to night coverage provider listed on amion.

## 2023-12-05 NOTE — Inpatient Diabetes Management (Signed)
 Inpatient Diabetes Program Recommendations  AACE/ADA: New Consensus Statement on Inpatient Glycemic Control (2015)  Target Ranges:  Prepandial:   less than 140 mg/dL      Peak postprandial:   less than 180 mg/dL (1-2 hours)      Critically ill patients:  140 - 180 mg/dL   Lab Results  Component Value Date   GLUCAP 219 (H) 12/05/2023   HGBA1C 11.2 (H) 12/03/2023    Review of Glycemic Control  Latest Reference Range & Units 12/04/23 21:22 12/05/23 06:08 12/05/23 12:20  Glucose-Capillary 70 - 99 mg/dL 836 (H) 697 (H) 780 (H)  (H): Data is abnormally high Diabetes history: Type 2 DM Outpatient Diabetes medications: Metformin 1000 mg BID, Jardiance 10 mg QD Current orders for Inpatient glycemic control: Novolog  0-15 units TID & HS, Lantus  15 units QD  Inpatient Diabetes Program Recommendations:    Discharge Recommendations: Intermediate acting recommendations: insulin  isophane & regular (NOVOLIN 70/30 RELION PEN) ([facility] Only) 16 untis BID  Supply/Referral recommendations: Pen needles - standard Glucometer Test strips Lancet device Lancets   Use Adult Diabetes Insulin  Treatment Post Discharge order set.   Consider switching to Novolog  70/30 16 units BID.  Spoke with patient using spanish interpreter regarding outpatient diabetes management.  Reviewed patient's current A1c of 11.2%. Explained what a A1c is and what it measures. Also reviewed goal A1c with patient, importance of good glucose control @ home, and blood sugar goals. Reviewed patho of DM, need for improvement to glycemic control, impact from vascular standpoint, signs and symptoms of hyper vs hypo glycemia, Novolin 70/30, and other commorbidities.  Patient has a meter at home. Education provided on frequency and how to obtain additional supplies. Reviewed when to call PCP.  Admits to drinking sugary beverages daily. Reviewed alternatives, importance of protein and plate method. Dietician consult placed.  LWWDM  booklet ordered.  Educated patient and spouse on insulin  pen use at home. Reviewed contents of insulin  flexpen starter kit. Reviewed all steps of insulin  pen including attachment of needle, 2-unit air shot, dialing up dose, giving injection, removing needle, disposal of sharps, storage of unused insulin , disposal of insulin  etc. Patient able to provide successful return demonstration. Also reviewed troubleshooting with insulin  pen. MD to give patient Rxs for insulin  pens and insulin  pen needles.    Thanks, Tinnie Minus, MSN, RNC-OB Diabetes Coordinator [phone] (8a-5p)

## 2023-12-05 NOTE — Evaluation (Signed)
 Speech Language Pathology Evaluation Patient Details Name: Frank Kim MRN: [id number] DOB: 31-Jul-1965 Today's Date: 12/05/2023 Time: 8454-8394 SLP Time Calculation (min) (ACUTE ONLY): 20 min  Problem List:  Patient Active Problem List   Diagnosis Date Noted   CVA (cerebral vascular accident) (HCC) 12/04/2023   Anterior cerebral aneurysm 12/04/2023   Uncontrolled type 2 diabetes mellitus with hyperglycemia, without long-term current use of insulin  (HCC) 12/04/2023   Renal insufficiency 12/04/2023   Essential hypertension 12/04/2023   Hyperlipidemia 12/04/2023   Obesity (BMI 30-39.9) 12/04/2023   Past Medical History:  Past Medical History:  Diagnosis Date   Diabetes mellitus without complication (HCC)    Past Surgical History: History reviewed. No pertinent surgical history. HPI:  Pt is a 59 y/I male presenting on 9/17 with headache, blurry vision, and L eye pain for 4 days. MRI with L PCA territory infarcts with remote R PCA infarcts. 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 PMH: DM2. OT noted reduced way finding and concern for L visual cut. Pt reported his family was concerned about some slurred speech and reduced memory of prior conversations.   Assessment / Plan / Recommendation Clinical Impression   Pt presents with mild cognitive-linguistic deficits per informal speech/language/cognitive assessment completed today. Expressive and receptive language functions observed to be grossly WNL. Mildly reduced speech intelligibility at the conversation level per report of interpreter, however, recommend further assessment with live interpreter or spanish speaking SLP as video interpretation misunderstanding could be due to tech issue. Regarding cognition, observed concern for high level memory and attention deficits. Further, formal cognitive assessment recommended.   Continue SLP PoC to address cognitive-linguistic goals.  Recommend outpatient SLP services and family assistance with medication management and finances until they feel confident that Frank Kim can manage this independently.     SLP Assessment  SLP Recommendation/Assessment: Patient needs continued Speech Language Pathology Services SLP Visit Diagnosis: Cognitive communication deficit (R41.841)     Assistance Recommended at Discharge  Set up Supervision/Assistance  Functional Status Assessment Patient has had a recent decline in their functional status and demonstrates the ability to make significant improvements in function in a reasonable and predictable amount of time.  Frequency and Duration min 1 x/week  4 weeks      SLP Evaluation Cognition  Overall Cognitive Status: Impaired/Different from baseline Arousal/Alertness: Awake/alert Orientation Level: Oriented X4 Year: 2025 Month: September Day of Week: Incorrect (oen day off) Attention: Focused;Sustained Focused Attention: Appears intact Sustained Attention: Impaired Sustained Attention Impairment: Verbal complex Memory: Impaired Memory Impairment: Decreased short term memory Decreased Short Term Memory: Verbal complex Awareness: Appears intact Problem Solving:  (further assessment needed)       Comprehension  Auditory Comprehension Overall Auditory Comprehension: Appears within functional limits for tasks assessed Commands: Within Functional Limits Conversation: Complex Visual Recognition/Discrimination Discrimination: Within Function Limits    Expression Expression Primary Mode of Expression: Verbal Verbal Expression Overall Verbal Expression: Appears within functional limits for tasks assessed Initiation: No impairment Automatic Speech: Name;Social Response Level of Generative/Spontaneous Verbalization: Conversation Repetition: No impairment Naming: No impairment Pragmatics: No impairment Non-Verbal Means of Communication: Not applicable   Oral / Motor   Oral Motor/Sensory Function Overall Oral Motor/Sensory Function: Within functional limits Motor Speech Overall Motor Speech: Appears within functional limits for tasks assessed Phonation: Normal Resonance: Within functional limits Articulation: Impaired Level of Impairment: Conversation Intelligibility: Intelligibility reduced Conversation: 75-100% accurate (interpreter had some difficulty understanding longer portions of conversational responses) Motor Planning: Within functional limits Motor  Speech Errors: Not applicable Effective Techniques: Slow rate            Frank Kim 12/05/2023, 4:33 PM

## 2023-12-05 NOTE — Plan of Care (Signed)
  Problem: SLP Cognition Goals Goal: Patient will demonstrate attention to functional Description: Patient will demonstrate attention to functional task with Flowsheets (Taken 12/05/2023 1638) Patient will demonstrate _____ attention to functional task with ____:  sustained  alternating  modified independence Goal: Patient will utilize external memory aids Description: Patient will utilize external memory aids to facilitate recall of information for improved safety with Flowsheets (Taken 12/05/2023 1638) Patient will utilize external memory aids to facilitate recall of ____ information for improved safety with ______:  complex  basic  modified independence   Problem: SLP Language Goals Goal: Patient will utilize speech intelligibility Description: Patient will utilize speech intelligibility strategies to  enhance communication with Flowsheets (Taken 12/05/2023 1638) Patient will utilize speech intelligibility strategies to enhance communication with ____: modified independence

## 2023-12-05 NOTE — Evaluation (Signed)
 Occupational Therapy Evaluation Patient Details Name: Frank Kim MRN: [id number] DOB: March 21, 1965 Today's Date: 12/05/2023   History of Present Illness   Pt is a 58 y/o male who presents 12/03/2023 with headache, blurry vision, and L eye pain for 4 days PTA. 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 PMH: DM II.     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 Pt admitted for above and presents with problem list below, including impaired vision, decreased cognition, and decreased FMC/sensation in L UE.  Pt is able to mobilize with contact guard assist and manage Adls with up to min assist. Vision is difficult to assess, but appears to have difficulty with sustained visual attention outside of central gaze, visual field deficits in bil upper quadrants and R lower? (Requiring head turns in R lower but not L lower)-- but pt only reporting blurry vision. Cognitively, pt with difficulty problem solving and sequencing. Based on performance today, believe pt will best benefit from continued OT services acutely and after dc at an inpatient setting with >3hrs/day to optimize independence, safety and return to PLOF with ADLs and mobility. If intensive inpatient therapy is not an option, would highly recommend 24/7 assist and outpatient OT services.  He will need assist for medications, fiances, and driving and cooking at dc.  Will follow acutely.      If plan is discharge home, recommend the following:   A little help with walking and/or transfers;A little help with bathing/dressing/bathroom;Assistance with cooking/housework;Direct supervision/assist for medications management;Direct supervision/assist for financial management;Assist for transportation;Help with stairs or ramp for entrance;Supervision due to cognitive status     Functional  Status Assessment   Patient has had a recent decline in their functional status and demonstrates the ability to make significant improvements in function in a reasonable and predictable amount of time.     Equipment Recommendations   BSC/3in1     Recommendations for Other Services   Rehab consult     Precautions/Restrictions   Precautions Precautions: Fall Recall of Precautions/Restrictions: Impaired Precaution/Restrictions Comments: Requires cues for safety, impaired vision Restrictions Weight Bearing Restrictions Per Provider Order: No     Mobility Bed Mobility Overal bed mobility: Modified Independent             General bed mobility comments: HOB flat and no use of rails.    Transfers Overall transfer level: Needs assistance Equipment used: None Transfers: Sit to/from Stand Sit to Stand: Supervision           General transfer comment: No physical assist required, however pt with R lateral lean with stand. Close supervision for safety.      Balance Overall balance assessment: Needs assistance Sitting-balance support: Feet supported, No upper extremity supported Sitting balance-Leahy Scale: Fair     Standing balance support: No upper extremity supported, During functional activity Standing balance-Leahy Scale: Fair Standing balance comment: borderline poor                           ADL either performed or assessed with clinical judgement   ADL Overall ADL's : Needs assistance/impaired     Grooming: Minimal assistance;Standing           Upper Body Dressing : Set up;Sitting   Lower Body Dressing: Sit to/from stand;Contact guard assist   Toilet Transfer: Contact guard assist;Ambulation  Functional mobility during ADLs: Contact guard assist;Cueing for safety       Vision Baseline Vision/History: 1 Wears glasses (readers but not frequently per pt) Ability to See in Adequate Light: 1 Impaired Patient Visual  Report: Blurring of vision Vision Assessment?: Yes Eye Alignment: Within Functional Limits Ocular Range of Motion: Within Functional Limits Alignment/Gaze Preference: Within Defined Limits Tracking/Visual Pursuits: Other (comment) Visual Fields: Other (comment) (difficutly with maintaining central gaze, able to see # of fingers in L lower quadrant and possibly some in R lower quadrant but not in upper quadrants) Diplopia Assessment:  (pt denies) Additional Comments: pt reports vision as blurry, initally states able to see distance clearly but then when assessed pt unable reports all vision as blurry.  Pt able to scan, with decrased attention and sustained gaze outside of central vision.  Central vision appears functional but with quadrant testing unable to see in upper quadrants and some of R quadrant.  FUnctionally in hallway only able to see large numbers (room number signs) but tends to lean body towards R and raising up on toes to make numbers easier to see.  He is able to see large objects in central field.  Needs further assessment.     Perception         Praxis         Pertinent Vitals/Pain Pain Assessment Pain Assessment: Faces Faces Pain Scale: Hurts a little bit Pain Location: eyes Pain Descriptors / Indicators: Discomfort Pain Intervention(s): Limited activity within patient's tolerance, Monitored during session, Repositioned     Extremity/Trunk Assessment Upper Extremity Assessment Upper Extremity Assessment: LUE deficits/detail;Right hand dominant LUE Deficits / Details: decreased FMC and sensation, strength intact LUE Sensation: decreased light touch LUE Coordination: decreased fine motor   Lower Extremity Assessment Lower Extremity Assessment: Defer to PT evaluation   Cervical / Trunk Assessment Cervical / Trunk Assessment: Normal   Communication Communication Communication: No apparent difficulties Factors Affecting Communication: Other (comment) (utilized  in person interpreter)   Cognition Arousal: Alert Behavior During Therapy: WFL for tasks assessed/performed Cognition: Cognition impaired     Awareness: Online awareness impaired   Attention impairment (select first level of impairment): Selective attention Executive functioning impairment (select all impairments): Organization, Sequencing, Reasoning, Problem solving OT - Cognition Comments: patient demonstrates difficulty with problem sovling, able to follow commands with increased time.  After short distance moblity in hallway pt able to recall room number but not how to get there. Use signs to help explain, but pt unable to tell thearpist if his room number 21 was between 17 and 24                 Following commands: Intact       Cueing  General Comments   Cueing Techniques: Verbal cues;Gestural cues      Exercises     Shoulder Instructions      Home Living Family/patient expects to be discharged to:: Private residence Living Arrangements: Other relatives (brothers - 3) Available Help at Discharge: Family;Available PRN/intermittently Type of Home: House Home Access: Level entry     Home Layout: One level     Bathroom Shower/Tub: Chief Strategy Officer: Standard     Home Equipment: None          Prior Functioning/Environment Prior Level of Function : Independent/Modified Independent               ADLs Comments: Holiday representative work    OT Problem List: Decreased activity tolerance;Pain;Decreased knowledge  of precautions;Decreased knowledge of use of DME or AE;Decreased safety awareness;Decreased cognition;Decreased coordination;Impaired vision/perception;Impaired balance (sitting and/or standing);Impaired sensation   OT Treatment/Interventions: Self-care/ADL training;Therapeutic exercise;Neuromuscular education;DME and/or AE instruction;Therapeutic activities;Cognitive remediation/compensation;Patient/family education;Balance training       OT Goals(Current goals can be found in the care plan section)   Acute Rehab OT Goals Patient Stated Goal: see better OT Goal Formulation: With patient Time For Goal Achievement: 12/19/23 Potential to Achieve Goals: Fair   OT Frequency:  Min 3X/week    Co-evaluation   Reason for Co-Treatment: For patient/therapist safety;To address functional/ADL transfers PT goals addressed during session: Mobility/safety with mobility;Balance;Strengthening/ROM        AM-PAC OT 6 Clicks Daily Activity     Outcome Measure Help from another person eating meals?: A Lot Help from another person taking care of personal grooming?: A Little Help from another person toileting, which includes using toliet, bedpan, or urinal?: A Lot Help from another person bathing (including washing, rinsing, drying)?: A Little Help from another person to put on and taking off regular upper body clothing?: A Little Help from another person to put on and taking off regular lower body clothing?: A Little 6 Click Score: 16   End of Session Equipment Utilized During Treatment: Gait belt Nurse Communication: Mobility status  Activity Tolerance: Patient tolerated treatment well Patient left: in chair;with call bell/phone within reach;with chair alarm set  OT Visit Diagnosis: Pain;Low vision, both eyes (H54.2);Unsteadiness on feet (R26.81);Other symptoms and signs involving cognitive function Pain - part of body:  (eyes)                Time: 1001-1034 OT Time Calculation (min): 33 min Charges:  OT General Charges $OT Visit: 1 Visit OT Evaluation $OT Eval Moderate Complexity: 1 Mod  Etta NOVAK, OT Acute Rehabilitation Services Office [phone] Secure Chat Preferred    Etta GORMAN Hope 12/05/2023, 12:46 PM

## 2023-12-05 NOTE — Evaluation (Signed)
 Physical Therapy Evaluation  Patient Details Name: Frank Kim MRN: [id number] DOB: [birth year] Today's Date: 12/05/2023  History of Present Illness  Pt is a 58 y/o male who presents 12/03/2023 with headache, blurry vision, and L eye pain for 4 days PTA. 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 PMH: DM II.   Clinical Impression  Pt admitted with above diagnosis and seen in conjunction with OT to maximize safe functional mobility assessment, and utilize in-person interpreter. Pt currently with functional limitations due to the deficits listed below (see PT Problem List). At the time of PT eval pt was able to perform transfers and ambulation with gross CGA to close supervision for safety, however pt with extensive visual field cut and at high risk of falls when attempting to compensate. Pt would benefit from intensive, multidisciplinary rehab >3 hours/day at d/c to maximize compensation strategies and prepare pt for return home with intermittent support from family. Pt motivated to participate and has the potential to progress well with continued skilled therapy intervention. Pt will benefit from acute skilled PT to increase their independence and safety with mobility to allow discharge.           If plan is discharge home, recommend the following: A little help with walking and/or transfers;A little help with bathing/dressing/bathroom;Assistance with cooking/housework;Assist for transportation   Can travel by private vehicle        Equipment Recommendations None recommended by PT  Recommendations for Other Services  Rehab consult    Functional Status Assessment Patient has had a recent decline in their functional status and demonstrates the ability to make significant improvements in function in a reasonable and predictable amount of time.     Precautions /  Restrictions Precautions Precautions: Fall Recall of Precautions/Restrictions: Impaired Precaution/Restrictions Comments: Requires cues Restrictions Weight Bearing Restrictions Per Provider Order: No      Mobility  Bed Mobility Overal bed mobility: Modified Independent             General bed mobility comments: HOB flat and no use of rails.    Transfers Overall transfer level: Needs assistance Equipment used: None Transfers: Sit to/from Stand Sit to Stand: Supervision           General transfer comment: No physical assist required, however pt with R lateral lean with stand. Close supervision for safety.    Ambulation/Gait Ambulation/Gait assistance: Contact guard assist Gait Distance (Feet): 200 Feet Assistive device: None Gait Pattern/deviations: Step-through pattern, Decreased stride length, Staggering right, Drifts right/left Gait velocity: Decreased Gait velocity interpretation: <1.31 ft/sec, indicative of household ambulator   General Gait Details: close, hands on guarding for support throughout gait training. Pt with difficulty identifying items and colors in the hallway, consistent R lateral lean with large amplitude lean at times when attempting to put items in L lower field of vision. R lateral lean worse with turns.  Stairs            Wheelchair Mobility     Tilt Bed    Modified Rankin (Stroke Patients Only) Modified Rankin (Stroke Patients Only) Pre-Morbid Rankin Score: No significant disability Modified Rankin: Moderately severe disability     Balance Overall balance assessment: Needs assistance Sitting-balance support: Feet supported, No upper extremity supported Sitting balance-Leahy Scale: Fair     Standing balance support: No upper extremity supported, During functional activity Standing balance-Leahy Scale: Fair Standing balance comment: borderline poor  Pertinent Vitals/Pain Pain  Assessment Pain Assessment: Faces Faces Pain Scale: Hurts a little bit Pain Location: eyes Pain Descriptors / Indicators: Discomfort Pain Intervention(s): Limited activity within patient's tolerance, Monitored during session, Repositioned    Home Living Family/patient expects to be discharged to:: Private residence Living Arrangements: Other relatives (brothers - 3) Available Help at Discharge: Family;Available PRN/intermittently Type of Home: House Home Access: Level entry       Home Layout: One level Home Equipment: None      Prior Function Prior Level of Function : Independent/Modified Independent               ADLs Comments: Holiday representative work     Extremity/Trunk Assessment   Upper Extremity Assessment Upper Extremity Assessment: Right hand dominant LUE Sensation: decreased light touch    Lower Extremity Assessment Lower Extremity Assessment: Overall WFL for tasks assessed (Grossly 5/5 strength in quads, hip flexors, ankle DF. 5/5 strength R hamstring, 4+/5 strength L hamstring)    Cervical / Trunk Assessment Cervical / Trunk Assessment: Normal  Communication   Communication Communication: No apparent difficulties    Cognition Arousal: Alert Behavior During Therapy: WFL for tasks assessed/performed   PT - Cognitive impairments: Memory, Problem solving, Safety/Judgement                         Following commands: Intact       Cueing Cueing Techniques: Verbal cues, Gestural cues     General Comments      Exercises     Assessment/Plan    PT Assessment Patient needs continued PT services  PT Problem List Decreased activity tolerance;Decreased balance;Decreased mobility;Decreased knowledge of use of DME;Decreased safety awareness;Decreased knowledge of precautions       PT Treatment Interventions DME instruction;Gait training;Functional mobility training;Therapeutic activities;Therapeutic exercise;Balance training;Patient/family  education    PT Goals (Current goals can be found in the Care Plan section)  Acute Rehab PT Goals Patient Stated Goal: None stated PT Goal Formulation: With patient Time For Goal Achievement: 12/19/23 Potential to Achieve Goals: Good    Frequency Min 3X/week     Co-evaluation PT/OT/SLP Co-Evaluation/Treatment: Yes Reason for Co-Treatment: For patient/therapist safety;To address functional/ADL transfers PT goals addressed during session: Mobility/safety with mobility;Balance;Strengthening/ROM         AM-PAC PT 6 Clicks Mobility  Outcome Measure Help needed turning from your back to your side while in a flat bed without using bedrails?: None Help needed moving from lying on your back to sitting on the side of a flat bed without using bedrails?: None Help needed moving to and from a bed to a chair (including a wheelchair)?: A Little Help needed standing up from a chair using your arms (e.g., wheelchair or bedside chair)?: A Little Help needed to walk in hospital room?: A Little Help needed climbing 3-5 steps with a railing? : A Little 6 Click Score: 20    End of Session Equipment Utilized During Treatment: Gait belt Activity Tolerance: Patient tolerated treatment well Patient left: in chair;with call bell/phone within reach;with chair alarm set Nurse Communication: Mobility status PT Visit Diagnosis: Unsteadiness on feet (R26.81);Difficulty in walking, not elsewhere classified (R26.2);Other symptoms and signs involving the nervous system (R29.898)    Time: 8991-8964 PT Time Calculation (min) (ACUTE ONLY): 27 min   Charges:   PT Evaluation $PT Eval Moderate Complexity: 1 Mod   PT General Charges $$ ACUTE PT VISIT: 1 Visit         Leita Sable, PT,  DPT Acute Rehabilitation Services Secure Chat Preferred Office: [phone]   Leita JONETTA Sable 12/05/2023, 11:01 AM

## 2023-12-06 LAB — GLUCOSE, CAPILLARY
Glucose-Capillary: 181 mg/dL — ABNORMAL HIGH (ref 70–99)
Glucose-Capillary: 223 mg/dL — ABNORMAL HIGH (ref 70–99)

## 2023-12-06 MED ORDER — INSULIN ASPART PROT & ASPART (70-30 MIX) 100 UNIT/ML PEN: 15.0000 [IU] | PEN_INJECTOR | Freq: Two times a day (BID) | SUBCUTANEOUS | 0 refills | Status: AC

## 2023-12-06 MED ORDER — ENSURE PLUS HIGH PROTEIN PO LIQD: 237.0000 mL | Freq: Two times a day (BID) | ORAL | 2 refills | Status: AC

## 2023-12-06 MED ORDER — LANCETS MISC: 1.0000 | Freq: Three times a day (TID) | 0 refills | Status: AC

## 2023-12-06 MED ORDER — BLOOD GLUCOSE MONITORING SUPPL DEVI: 1.0000 | Freq: Three times a day (TID) | 0 refills | Status: AC

## 2023-12-06 MED ORDER — INSULIN ASPART PROT & ASPART (70-30 MIX) 100 UNIT/ML ~~LOC~~ SUSP
16.0000 [IU] | Freq: Two times a day (BID) | SUBCUTANEOUS | Status: DC
  Filled 2023-12-06: qty 10

## 2023-12-06 MED ORDER — ASPIRIN 81 MG PO TBEC: 81.0000 mg | DELAYED_RELEASE_TABLET | Freq: Every day | ORAL | 12 refills | Status: AC

## 2023-12-06 MED ORDER — PEN NEEDLES 31G X 5 MM MISC: 1.0000 | Freq: Three times a day (TID) | 0 refills | Status: AC

## 2023-12-06 MED ORDER — LANCET DEVICE MISC: 1.0000 | Freq: Three times a day (TID) | 0 refills | Status: AC

## 2023-12-06 MED ORDER — BLOOD GLUCOSE TEST VI STRP: 1.0000 | ORAL_STRIP | Freq: Three times a day (TID) | 0 refills | Status: AC

## 2023-12-06 MED ORDER — NAPHAZOLINE-GLYCERIN 0.012-0.25 % OP SOLN: 1.0000 [drp] | Freq: Four times a day (QID) | OPHTHALMIC | 0 refills | Status: AC | PRN

## 2023-12-06 MED ORDER — ATORVASTATIN CALCIUM 40 MG PO TABS: 40.0000 mg | ORAL_TABLET | Freq: Every day | ORAL | 2 refills | Status: AC

## 2023-12-06 MED ORDER — ENSURE PLUS HIGH PROTEIN PO LIQD
237.0000 mL | Freq: Two times a day (BID) | ORAL | Status: DC
  Administered 2023-12-06: 237 mL via ORAL

## 2023-12-06 MED ORDER — CLOPIDOGREL BISULFATE 75 MG PO TABS: 75.0000 mg | ORAL_TABLET | Freq: Every day | ORAL | 2 refills | Status: AC

## 2023-12-06 NOTE — Progress Notes (Signed)
 Pt is being Dc'd home. All IV lines removed and DC instructions/medications reviewed. VSS.

## 2023-12-06 NOTE — Progress Notes (Addendum)
 Nutrition Education Note  RD consulted for nutrition education regarding diabetes.   Lab Results  Component Value Date   HGBA1C 11.2 (H) 12/03/2023    RD added the following Spanish handout to discharge instructions: Carbohydrate Counting for People with Diabetes.    Body mass index is 45.19 kg/m. Pt meets criteria for morbid obesity based on current BMI.  Current diet order is carbohydrate modified. Labs and medications reviewed. Plans for d/c home today. No further nutrition interventions warranted at this time.   Suzen HUNT RD, LDN, CNSC Contact via secure chat. If unavailable, use group chat RD Inpatient.

## 2023-12-06 NOTE — Plan of Care (Signed)
  Problem: Education: Goal: Ability to describe self-care measures that may prevent or decrease complications (Diabetes Survival Skills Education) will improve Outcome: Progressing   Problem: Coping: Goal: Ability to adjust to condition or change in health will improve Outcome: Progressing   Problem: Tissue Perfusion: Goal: Adequacy of tissue perfusion will improve Outcome: Progressing   Problem: Education: Goal: Knowledge of disease or condition will improve Outcome: Progressing Goal: Knowledge of secondary prevention will improve (MUST DOCUMENT ALL) Outcome: Progressing Goal: Knowledge of patient specific risk factors will improve (DELETE if not current risk factor) Outcome: Progressing   Problem: Ischemic Stroke/TIA Tissue Perfusion: Goal: Complications of ischemic stroke/TIA will be minimized Outcome: Progressing

## 2023-12-06 NOTE — Discharge Summary (Signed)
 Physician Discharge Summary   Patient: Frank Kim MRN: [id number] DOB: [birth year]  Admit date:     12/03/2023  Discharge date: 12/06/23  Discharge Physician: Elgie Butter   PCP: Pcp, No   Recommendations at discharge:  Please follow up with Neurology, PCP as recommended.  Recommend outpatient follow up with Neuro interventional radiology follow up .    Discharge Diagnoses: Principal Problem:   CVA (cerebral vascular accident) [facility]) Active Problems:   Anterior cerebral aneurysm   Uncontrolled type 2 diabetes mellitus with hyperglycemia, without long-term current use of insulin  (HCC)   Renal insufficiency   Essential hypertension   Hyperlipidemia   Obesity (BMI 30-39.9)  Resolved Problems:   * No resolved hospital problems. *  Hospital Course: Frank Kim is a 58 y.o. male with medical history significant of hypertension, hyperlipidemia,  and diabetes mellitus type 2 presents with headache and blurry vision.   He has been experiencing pain in the occipital region of his head for the past five to six days, which worsens when bending forward. He also has blurry vision, primarily affecting the left eye, with some involvement of the right eye, and this is associated with pain.   He reports weakness on the left side of his body, which began around the same time as the headache, approximately four to five days ago.   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   Assessment and Plan:  Acute left PCA territory infact Remote right PCA territory infarcts.  Neurology on  board.  Recommended aspirin  and plavix  for 3 months followed by aspirin  alone.  LDL is 79, added statin. A1c is 11.2 Risk factor  modification.  Therapy evaluations recommending CIR. But he doesn't have medical necessity. Hence outpatient neuro rehab has been set up.  Toc aware.  UD S negative.        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      Type 2 DM uncontrolled with hyperglycemia Started him on novolog  70/30 16 units BID.  Follow up with PCP in 2 weeks.       Hypertension.  Suboptimal.  Restart lisinopril .     Mild AKI Resolved.    Consultants: neurology NIR Procedures performed: ECHO  Disposition: Home Diet recommendation:  Discharge Diet Orders (From admission, onward)     Start     Ordered   12/06/23 0000  Diet - low sodium heart healthy        12/06/23 1301           Carb modified diet DISCHARGE MEDICATION: Allergies as of 12/06/2023   No Known Allergies      Medication List     TAKE these medications    amLODipine 5 MG tablet Commonly known as: NORVASC Take 5 mg by mouth daily.   aspirin  EC 81 MG tablet Take 1 tablet (81 mg total) by mouth daily. Swallow whole. Start taking on: December 07, 2023   atorvastatin  40 MG tablet Commonly known as: LIPITOR Take 1 tablet (40 mg total) by mouth daily. Start taking on: December 07, 2023 What changed:  medication strength how much to take   Blood  Glucose Monitoring Suppl Devi 1 each by Does not apply route 3 (three) times daily. May dispense any manufacturer covered by patient's insurance.   BLOOD GLUCOSE TEST STRIPS Strp 1 each by Does not apply route 3 (three) times daily. Use as directed to check blood sugar. May dispense any manufacturer covered by patient's insurance and fits patient's device.   clopidogrel  75 MG tablet Commonly known as: PLAVIX  Take 1 tablet (75 mg total) by mouth daily. Start taking on: December 07, 2023   feeding supplement Liqd Take 237 mLs by mouth 2 (two) times daily between meals.   insulin  aspart  protamine - aspart (70-30) 100 UNIT/ML FlexPen Commonly known as: NOVOLOG  70/30 MIX Inject 15 Units into the skin 2 (two) times daily with a meal.   Jardiance 10 MG Tabs tablet Generic drug: empagliflozin Take 10 mg by mouth daily.   Lancet Device Misc 1 each by Does not apply route 3 (three) times daily. May dispense any manufacturer covered by patient's insurance.   Lancets Misc 1 each by Does not apply route 3 (three) times daily. Use as directed to check blood sugar. May dispense any manufacturer covered by patient's insurance and fits patient's device.   lisinopril-hydrochlorothiazide 20-25 MG tablet Commonly known as: ZESTORETIC Take 1 tablet by mouth daily.   metFORMIN 1000 MG tablet Commonly known as: GLUCOPHAGE Take 1,000 mg by mouth 2 (two) times daily.   naphazoline-glycerin  0.012-0.25 % Soln Commonly known as: CLEAR EYES REDNESS Place 1-2 drops into both eyes 4 (four) times daily as needed for up to 10 days for eye irritation.   Pen Needles 31G X 5 MM Misc 1 each by Does not apply route 3 (three) times daily. May dispense any manufacturer covered by patient's insurance.        Follow-up Information     [facility]. Schedule an appointment as soon as possible for a visit.   Specialty: Rehabilitation Why: Make sure you update them you do not have insurance. Contact information: [address] [place]  [phone]        [facility] Follow up on 01/16/2024.   Why: Your appointment is at 9:45 am. Please arrive early and bring admission packet you get in the mail, picture ID, current medications Contact information: [address] [place]  [address] [phone]        [facility] Outpatient pharmacy Follow up.   Why: Use this pharmacy for assistance with your medications Contact information: [address], [place], [place] [address] Phone: [phone]                Discharge Exam: Frank Kim   12/03/23 1508  Weight: 127 kg   General exam: Appears calm and comfortable  Respiratory system: Clear to auscultation. Respiratory effort normal. Cardiovascular system: S1 & S2 heard, RRR.  Gastrointestinal system: Abdomen is nondistended, soft and nontender.  Central nervous system: Alert and oriented. VISION ABNORMALITIES slowly improving Extremities: Symmetric 5 x 5 power. Skin: No rashes, Psychiatry: Mood & affect appropriate.    Condition at discharge: fair  The results of significant diagnostics from this hospitalization (including imaging, microbiology, ancillary and laboratory) are listed below for reference.   Imaging Studies: ECHOCARDIOGRAM COMPLETE Result Date: 12/04/2023    ECHOCARDIOGRAM REPORT   Patient Name:   Danyel LEOS-CORONADO Date of Exam: 12/04/2023 Medical Rec #:  [id number]            Height:  66.0 in Accession #:    [id number]           Weight:       280.0 lb Date of Birth:  [birth year]            BSA:          2.307 m Patient Age:    58 years             BP:           143/97 mmHg Patient Gender: M                    HR:           48 bpm. Exam Location:  Inpatient Procedure: 2D Echo, 3D Echo, Cardiac Doppler, Color Doppler and Strain Analysis            (Both Spectral and Color Flow Doppler were utilized during            procedure). Indications:    Stroke  History:        Patient has no prior history of Echocardiogram examinations.  Sonographer:    Philomena Daring Referring Phys: [id number] RONDELL A SMITH  Sonographer Comments: Global longitudinal strain was attempted. IMPRESSIONS  1. Left ventricular ejection fraction, by estimation, is 60 to 65%. Left ventricular ejection fraction by 3D volume is 65 %. The left ventricle has normal function. The left ventricle has no regional wall motion abnormalities. There is mild concentric left ventricular hypertrophy. Left ventricular diastolic parameters are consistent with Grade I  diastolic dysfunction (impaired relaxation). The average left ventricular global longitudinal strain is -13.1 %. The global longitudinal strain is abnormal.  2. Right ventricular systolic function is normal. The right ventricular size is normal. There is normal pulmonary artery systolic pressure.  3. The mitral valve is normal in structure. Trivial mitral valve regurgitation. No evidence of mitral stenosis.  4. The aortic valve is tricuspid. Aortic valve regurgitation is mild. No aortic stenosis is present.  5. The inferior vena cava is normal in size with greater than 50% respiratory variability, suggesting right atrial pressure of 3 mmHg. FINDINGS  Left Ventricle: Left ventricular ejection fraction, by estimation, is 60 to 65%. Left ventricular ejection fraction by 3D volume is 65 %. The left ventricle has normal function. The left ventricle has no regional wall motion abnormalities. The average left ventricular global longitudinal strain is -13.1 %. Strain was performed and the global longitudinal strain is abnormal. The left ventricular internal cavity size was normal in size. There is mild concentric left ventricular hypertrophy. Left ventricular diastolic parameters are consistent with Grade I diastolic dysfunction (impaired relaxation). Indeterminate filling pressures. Right Ventricle: The right ventricular size is normal. No increase in right ventricular wall thickness. Right ventricular systolic function is normal. There is normal pulmonary artery systolic pressure. The tricuspid regurgitant velocity is 1.30 m/s, and  with an assumed right atrial pressure of 3 mmHg, the estimated right ventricular systolic pressure is 9.8 mmHg. Left Atrium: Left atrial size was normal in size. Right Atrium: Right atrial size was normal in size. Pericardium: There is no evidence of pericardial effusion. Mitral Valve: The mitral valve is normal in structure. Trivial mitral valve regurgitation. No evidence of mitral valve  stenosis. Tricuspid Valve: The tricuspid valve is normal in structure. Tricuspid valve regurgitation is not demonstrated. No evidence of tricuspid stenosis. Aortic Valve: The aortic valve is tricuspid. Aortic valve regurgitation is mild. No aortic stenosis is present. Pulmonic Valve: The  pulmonic valve was normal in structure. Pulmonic valve regurgitation is not visualized. No evidence of pulmonic stenosis. Aorta: The aortic root is normal in size and structure. Venous: The inferior vena cava is normal in size with greater than 50% respiratory variability, suggesting right atrial pressure of 3 mmHg. IAS/Shunts: No atrial level shunt detected by color flow Doppler. Additional Comments: 3D was performed not requiring image post processing on an independent workstation and was normal.  LEFT VENTRICLE PLAX 2D LVIDd:         4.87 cm         Diastology LVIDs:         3.18 cm         LV e' medial:    5.77 cm/s LV PW:         1.32 cm         LV E/e' medial:  10.5 LV IVS:        1.29 cm         LV e' lateral:   9.14 cm/s LVOT diam:     1.95 cm         LV E/e' lateral: 6.7 LV SV:         62 LV SV Index:   27              2D Longitudinal LVOT Area:     2.99 cm        Strain                                2D Strain GLS   -13.5 %                                (A4C):                                2D Strain GLS   -12.7 %                                (A3C):                                2D Strain GLS   -13.0 %                                (A2C):                                2D Strain GLS   -13.1 %                                Avg:                                 3D Volume EF                                LV 3D EF:    Left  ventricul                                             ar                                             ejection                                             fraction                                             by 3D                                              volume is                                             65 %.                                 3D Volume EF:                                3D EF:        65 %                                LV EDV:       156 ml                                LV ESV:       54 ml                                LV SV:        102 ml RIGHT VENTRICLE             IVC RV S prime:     14.70 cm/s  IVC diam: 1.21 cm TAPSE (M-mode): 2.1 cm LEFT ATRIUM             Index        RIGHT ATRIUM           Index LA diam:        3.47 cm 1.50 cm/m   RA Area:     14.00 cm LA Vol (A2C):   62.7 ml 27.18 ml/m  RA Volume:   31.00 ml  13.44 ml/m LA Vol (A4C):   44.3 ml 19.20 ml/m LA Biplane Vol: 53.3 ml  23.10 ml/m  AORTIC VALVE LVOT Vmax:   110.00 cm/s LVOT Vmean:  73.400 cm/s LVOT VTI:    0.208 m  AORTA Ao Root diam: 3.02 cm Ao Asc diam:  3.31 cm MITRAL VALVE               TRICUSPID VALVE MV Area (PHT): 2.56 cm    TR Peak grad:   6.8 mmHg MV Decel Time: 296 msec    TR Vmax:        130.00 cm/s MV E velocity: 60.80 cm/s MV A velocity: 83.30 cm/s  SHUNTS MV E/A ratio:  0.73        Systemic VTI:  0.21 m                            Systemic Diam: 1.95 cm Annabella Scarce MD Electronically signed by Annabella Scarce MD Signature Date/Time: 12/04/2023/2:28:59 PM    Final    CT ANGIO HEAD NECK W WO CM Result Date: 12/04/2023 CLINICAL DATA:  58 year old male with acute left PCA territory infarct on MRI this morning. Blurred vision, fatigue for 4 days, headache. Diabetes EXAM: CT ANGIOGRAPHY HEAD AND NECK WITH AND WITHOUT CONTRAST TECHNIQUE: Multidetector CT imaging of the head and neck was performed using the standard protocol during bolus administration of intravenous contrast. Multiplanar CT image reconstructions and MIPs were obtained to evaluate the vascular anatomy. Carotid stenosis measurements (when applicable) are obtained utilizing NASCET criteria, using the distal internal carotid diameter as the denominator. RADIATION DOSE REDUCTION: This exam was  performed according to the departmental dose-optimization program which includes automated exposure control, adjustment of the mA and/or kV according to patient size and/or use of iterative reconstruction technique. CONTRAST:  75mL OMNIPAQUE  IOHEXOL  350 MG/ML SOLN COMPARISON:  Brain MRI 0236 hours today.  Head CT yesterday. FINDINGS: CT HEAD Brain: Stable non contrast CT appearance of the brain since yesterday Hypodense Cytotoxic edema in the left occipital lobe. No acute intracranial hemorrhage or mass effect. Contralateral encephalomalacia in the right PCA territory. Basilar cisterns remain patent. Stable gray-white matter differentiation throughout the brain. Calvarium and skull base: Intact. No acute osseous abnormality identified. Paranasal sinuses: Visualized paranasal sinuses and mastoids are stable and well aerated. Orbits: No acute orbit or scalp soft tissue finding. CTA NECK Skeleton: No acute osseous abnormality identified. Left maxillary sinus alveolar recess mucosal thickening which is in continuity with left posterior maxillary dental periapical lucency. Upper chest: Negative. Other neck: Nonvascular neck soft tissue spaces are within normal limits. Aortic arch: 3 vessel arch. Mild tortuosity of the visible distal arch, mild soft and calcified plaque there. Right carotid system: Mild brachiocephalic artery and right CCA origin tortuosity without plaque or stenosis. No significant right carotid bifurcation plaque, no stenosis to the skull base. Left carotid system: Tortuous Left CCA, ectatic measuring 8 mm in the neck. Ectatic left carotid bifurcation and proximal ICA. No bifurcation plaque or stenosis. Mild soft and calcified plaque at the ectatic left ICA bulb which is nearly 11 mm diameter (series 11, image 103). No associated stenosis. Tortuosity and ectasia of the vessel continues to the skull base. Vertebral arteries: Mild plaque of the proximal right subclavian artery which appears mildly ectatic  without stenosis. Normal right vertebral artery origin. Right vertebral artery remains patent to the skull base with intermittent mild irregularity and stenosis in the V2 segment (series 9, image 235, 230. No intimal flap is identified. No high-grade stenosis. Proximal left subclavian artery is  patent with minimal plaque and mild ectasia. Normal left vertebral artery origin. Left V1 paravertebral venous contrast limits detail of the vessel in that segment. Left V2 segment is patent and appears mildly dominant with occasional calcified plaque, no stenosis to the skull base. CTA HEAD Posterior circulation: Moderate abrupt tapering of the distal left vertebral artery as it crosses the dura on series 12, image 145, series 9, image 159. This is proximal to the left PICA origin which remains patent. The more distal left V4 segment caliber is normal to the vertebrobasilar junction. Contralateral right V4 segment intermittent calcified plaque and the vessel is occluded in a short segment between the patent right PICA origin on series 9, image 152 and reconstitution on image 147. Distal right V4 may be supplied in a retrograde fashion. Patent basilar artery with mild irregularity. No basilar stenosis. SCA origins are patent. Right PCA origin is patent. Fetal left PCA origin. Right posterior communicating artery is diminutive or absent. Right PCA is patent although with up to moderate multifocal P2 segment irregularity and stenosis. Similar right P3 moderate irregularity and stenosis. See series 14, image 22 and series 16, image 17. Fetal type left PCA is patent through the P2 segment but abruptly occludes at the P2/P3 segment junction on series 16, image 23, series 14, image 23 and series 12, image 159. There is some reconstitution. Anterior circulation: Both ICA siphons are patent. The left siphon is ectatic with mild calcified plaque and no stenosis. Normal left posterior communicating artery origin. Contralateral right  ICA siphon has a more normal caliber with up to moderate calcified plaque. Mild right anterior genu siphon stenosis. Patent carotid termini. Dominant left and diminutive or absent right ACA A1 segments. 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 See also series 15, image 41. Proximal A2 segments are patent with tortuosity. Remaining ACA branches are within normal limits. Both MCA origins are patent. Bilateral MCA M1 segments and bifurcations are mildly tortuous. Bilateral MCA branches are within normal limits. Venous sinuses: Early contrast timing, major dural venous sinuses appear to be patent. Anatomic variants: Dominant left and diminutive or absent right ACA A1 segments and fetal type left PCA origin with dominant and ectatic left ICA. Left vertebral artery also dominant. Review of the MIP images confirms the above findings IMPRESSION: 1. Abnormal intracranial CTA: - Left PCA Occlusion at the P2/P3 segment junction. Some P3 reconstitution. 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 Dominant left and diminutive or absent right A1 segments. Recommend Neuro endovascular follow-up. - short segment Occlusion of the non dominant Right Vertebral Artery V4 segment just distal to patent right PICA origin. - Mild to moderate stenosis of the more dominant Left Vertebral Artery as it crosses into the skull base. - multifocal Moderate stenoses of the right PCA. 2. Ectatic, dominant Left Carotid Artery in the neck and through the ICA siphon. Mild superimposed atherosclerosis, no left carotid stenosis. 3. Normal caliber Right Carotid. Mild to moderate Right ICA siphon calcified plaque and mild distal Right siphon stenosis. 4. Mild tortuosity and atherosclerosis at the distal aortic arch. Electronically Signed   By: VEAR Hurst M.D.   On: 12/04/2023 04:38   MR BRAIN WO  CONTRAST Result Date: 12/04/2023 CLINICAL DATA:  Headache, neuro deficit Neuro deficit, acute, stroke suspected EXAM: MRI HEAD WITHOUT CONTRAST TECHNIQUE: Multiplanar, multiecho pulse sequences of  the brain and surrounding structures were obtained without intravenous contrast. COMPARISON:  CT head 12/03/2023. FINDINGS: Brain: Acute left PCA territory infarcts involving the left occipital lobe and left thalamus. Edema without substantial mass effect. Remote right PCA territory infarcts. No evidence of acute hemorrhage, mass lesion, midline shift or hydrocephalus. Vascular: Major arterial flow voids are maintained at the skull base. Skull and upper cervical spine: Normal marrow signal. Sinuses/Orbits: Mild mucosal thickening.  No acute orbital findings. Other: No mastoid effusions. IMPRESSION: 1. Acute left PCA territory infarcts. 2. Remote right PCA territory infarcts. Electronically Signed   By: Gilmore GORMAN Molt M.D.   On: 12/04/2023 03:31   CT HEAD WO CONTRAST Result Date: 12/03/2023 CLINICAL DATA:  Left-sided headache 4 days with left facial numbness. Left arm and leg numbness four months. Blurry vision bilateral eyes. EXAM: CT HEAD WITHOUT CONTRAST TECHNIQUE: Contiguous axial images were obtained from the base of the skull through the vertex without intravenous contrast. RADIATION DOSE REDUCTION: This exam was performed according to the departmental dose-optimization program which includes automated exposure control, adjustment of the mA and/or kV according to patient size and/or use of iterative reconstruction technique. COMPARISON:  None Available. FINDINGS: Brain: Ventricles, cisterns and other CSF spaces are within normal. Old left occipital infarct. Larger old right PCA territory infarct. No evidence of acute infarction. No mass, mass effect or acute hemorrhage. Vascular: No hyperdense vessel or unexpected calcification. Skull: Normal. Negative for fracture or focal lesion. Sinuses/Orbits: No acute  finding. Other: None. IMPRESSION: 1. No acute findings. 2. Old left occipital infarct. Larger old right PCA territory infarct. Electronically Signed   By: Toribio Agreste M.D.   On: 12/03/2023 16:28    Microbiology: Results for orders placed or performed during the hospital encounter of 12/03/23  SARS Coronavirus 2 by RT PCR (hospital order, performed in [facility] hospital lab) *cepheid single result test* Anterior Nasal Swab     Status: None   Collection Time: 12/03/23 11:56 PM   Specimen: Anterior Nasal Swab  Result Value Ref Range Status   SARS Coronavirus 2 by RT PCR NEGATIVE NEGATIVE Final    Comment: Performed at [facility], [address], [place], [place] [address]    Labs: CBC: Recent Labs  Lab 12/03/23 1544 12/03/23 1545 12/05/23 0526  WBC 6.2  --  6.0  NEUTROABS 3.8  --   --   HGB 14.3 15.6 13.7  HCT 44.5 46.0 42.7  MCV 82.4  --  82.4  PLT 260  --  228   Basic Metabolic Panel: Recent Labs  Lab 12/03/23 1544 12/03/23 1545 12/05/23 0526  NA 137 135 136  K 4.5 4.2 3.6  CL 100 100 101  CO2 21*  --  25  GLUCOSE 428* 449* 192*  BUN 16 18 15   CREATININE 1.25* 1.00 0.86  CALCIUM  9.7  --  8.9   Liver Function Tests: Recent Labs  Lab 12/03/23 1544  AST 31  ALT 20  ALKPHOS 105  BILITOT 1.0  PROT 7.4  ALBUMIN 3.7   CBG: Recent Labs  Lab 12/05/23 1220 12/05/23 1624 12/05/23 2122 12/06/23 0608 12/06/23 1142  GLUCAP 219* 153* 167* 181* 223*    Discharge time spent: 41 minutes.   Signed: Elgie Butter, MD [facility] 12/06/2023

## 2023-12-06 NOTE — Progress Notes (Signed)
 Occupational Therapy Treatment Patient Details Name: Frank Kim MRN: [id number] DOB: 01-09-1966 Today's Date: 12/06/2023   History of present illness Pt is a 58 y/o male who presents 12/03/2023 with headache, blurry vision, and L eye pain for 4 days PTA. 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 PMH: DM II.   OT comments  Interpreter Frank Kim [phone]) used throughout session. Educated Frank Kim on compensatory strateiges for low vision including increasing contrast, reducing clutter and improving light source. Pt verbalized understanding. Font/setting adjusted on phone to help with accessibility. Encouraged pt to have his daughter set up more accessibility features including voice commands due to impaired vision. Continue to recommend follow up with outpt OT and follow up with an eye doctor to complete a full field visual assessment.       If plan is discharge home, recommend the following:  Assistance with cooking/housework;Direct supervision/assist for medications management;Direct supervision/assist for financial management;Assist for transportation;Help with stairs or ramp for entrance   Equipment Recommendations  None recommended by OT    Recommendations for Other Services      Precautions / Restrictions Precautions Precautions: Fall Recall of Precautions/Restrictions: Impaired Precaution/Restrictions Comments: low vision deficits       Mobility Bed Mobility                    Transfers                         Balance                                           ADL either performed or assessed with clinical judgement   ADL                                         General ADL Comments: able to complete basic ADL tasks with S; educated on home safety and reducing risk of falls    Extremity/Trunk  Assessment              Vision   Visual Fields: Left visual field deficit;Right visual field deficit (blurred central vision; L field appears more involved than R)   Perception     Praxis     Communication     Cognition Arousal: Alert Behavior During Therapy: WFL for tasks assessed/performed               OT - Cognition Comments: Repeating quesitons however feel repetition was most likely due to language barrier and difficulty with attention tdue to visual impairment                          Cueing      Exercises      Shoulder Instructions       General Comments      Pertinent Vitals/ Pain       Pain Assessment Pain Assessment: Faces Faces Pain Scale: Hurts little more Pain Location: eyes Pain Descriptors / Indicators: Discomfort Pain Intervention(s): Limited activity within patient's tolerance  Home Living  Prior Functioning/Environment              Frequency  Min 3X/week        Progress Toward Goals  OT Goals(current goals can now be found in the care plan section)  Progress towards OT goals: Progressing toward goals  Acute Rehab OT Goals Patient Stated Goal: for vision to return OT Goal Formulation: With patient Time For Goal Achievement: 12/19/23 Potential to Achieve Goals: Fair ADL Goals Pt Will Perform Grooming: with modified independence;standing Pt Will Perform Lower Body Dressing: with modified independence;sit to/from stand Additional ADL Goal #1: Pt will utilize visual compensatory techniques during ADL routine and navigation to optimize indepedence and safety. Additional ADL Goal #2: Pt will recall and complete 2 step trail making task with no more than min cueing for way finding.  Plan      Co-evaluation                 AM-PAC OT 6 Clicks Daily Activity     Outcome Measure   Help from another person eating meals?: None Help from another  person taking care of personal grooming?: A Little Help from another person toileting, which includes using toliet, bedpan, or urinal?: A Little Help from another person bathing (including washing, rinsing, drying)?: A Little Help from another person to put on and taking off regular upper body clothing?: A Little Help from another person to put on and taking off regular lower body clothing?: A Little 6 Click Score: 19    End of Session    OT Visit Diagnosis: Pain;Low vision, both eyes (H54.2);Unsteadiness on feet (R26.81);Other symptoms and signs involving cognitive function Pain - part of body:  (eyes)   Activity Tolerance Patient tolerated treatment well   Patient Left in bed;with call bell/phone within reach;with nursing/sitter in room   Nurse Communication Other (comment) (DCneeds)        Time: 8674-8650 OT Time Calculation (min): 24 min  Charges: OT General Charges $OT Visit: 1 Visit OT Treatments $Self Care/Home Management : 23-37 mins  Kreg Sink, OT/L   Acute OT Clinical Specialist Acute Rehabilitation Services Pager [phone] Office [phone]   [facility] 12/06/2023, 2:12 PM

## 2023-12-06 NOTE — Discharge Instructions (Addendum)
 SABRA

## 2023-12-09 ENCOUNTER — Telehealth: Payer: Self-pay

## 2023-12-09 DIAGNOSIS — I63532 Cerebral infarction due to unspecified occlusion or stenosis of left posterior cerebral artery: Secondary | ICD-10-CM

## 2023-12-09 NOTE — Transitions of Care (Post Inpatient/ED Visit) (Signed)
 Stroke Discharge Follow-up   12/09/2023 Name:  Frank Kim MRN:  [id number] DOB:  Aug 09, 1965  Subjective: [name] is a 58 y.o. year old male who is a primary care patient of Pcp, No An Emmi alert was received indicating patient responded to questions: Feeling worse overall?. I reached out by phone via Interpreter # [phone] to follow up on the alert.  Voice message left.  .  Care Management Interventions: Reviewed EMMI Dashboard  Follow up plan: Will attempt again the next business day.      Nason Conradt J. Candance Bohlman RN, MSN [facility], [facility] RN Care Manager Direct Dial: [phone]  Fax: [phone] Website: [email or web address]

## 2023-12-10 ENCOUNTER — Telehealth: Payer: Self-pay

## 2023-12-10 NOTE — Transitions of Care (Post Inpatient/ED Visit) (Signed)
 Stroke Discharge Follow-up   12/10/2023 Name:  Frank Kim MRN:  [id number] DOB:  10/14/1965  Subjective: Frank Kim is a 58 y.o. year old male who is a primary care patient of Pcp, No An Emmi alert was received indicating patient responded to questions: Feeling worse overall?. I reached out by phone to follow up on the alert and spoke to Patient.  Patient reports he is not feeling worse but his vision has not returned to normal.  Discussed stroke and that symptoms may or may not return to normal.  He verbalized understanding.  Patient reports he also has not call neuro-rehab-advised to call.    Care Management Interventions: Reviewed EMMI dashboard, outreached patient.  Reviewed stroke and recovery.  Gave information for blind services.    Follow up plan: No further intervention required.   Israel Wunder J. Paisleigh Maroney RN, MSN [facility], [facility] RN Care Manager Direct Dial: [phone]  Fax: [phone] Website: [email or web address]

## 2023-12-15 ENCOUNTER — Telehealth: Payer: Self-pay

## 2023-12-15 NOTE — Telephone Encounter (Signed)
 Pt answered call but does not speak english.SABRASABRAPt put brother on the phone and pt scheduled appt for 10/2 with Dr. Lester

## 2023-12-18 ENCOUNTER — Encounter: Payer: Self-pay | Admitting: Neuroradiology

## 2023-12-18 ENCOUNTER — Ambulatory Visit (INDEPENDENT_AMBULATORY_CARE_PROVIDER_SITE_OTHER): Payer: Self-pay | Admitting: Neuroradiology

## 2023-12-18 VITALS — BP 153/90 | HR 62 | Wt 210.8 lb

## 2023-12-18 DIAGNOSIS — I671 Cerebral aneurysm, nonruptured: Secondary | ICD-10-CM

## 2023-12-18 NOTE — Progress Notes (Signed)
 I had the pleasure of meeting Frank Kim in the office today to discuss an incidentally detected brain aneurysm.  Briefly, he had an MRI December 04, 2023 due to an acute left occipital infarct and left hippocampal infarct.  He has an old infarct in the right PCA territory with occipital encephalomalacia.  His echocardiogram 12/04/2023 showed a normal EF with no structural abnormality.  His CTA 12/04/2023 showed intracranial vertebral atherosclerotic disease, and bilateral posterior cerebral artery intracranial atherosclerotic disease.  His carotid artery bifurcations were basically normal.  Incidentally noted was a 4 mm anterior communicating artery aneurysm.  I reviewed that study.  The aneurysm has a relatively wide base and smooth dome.  Social:  He does not smoke.  He drinks 2-3 drinks a day.  Works in [employer].  Past medical history:  Diabetes, hypertension  Blood pressure is 153/90, heart rate is 62 and regular. Lungs are clear. Heart sounds are normal. Normal radial and femoral pulses. Alert and oriented with normal speech expression, fluency and comprehension. He has a left homonymous hemianopsia Face is symmetric. Strength in the arms and legs is symmetric with no drift.  Sensation is normal and symmetric. No ataxia. No inattention.  Assessment:  1.  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 3.  Neurologic deficit of left homonymous hemianopsia. 4.  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  Recommendation:  I have explained the diagnosis of an unruptured aneurysm.  I have explained that aneurysms may bleed (rupture), and if this happens it can be fatal or disabling.  I have also explained that some aneurysms have a very low statistical risk of  bleeding, in which case the risk of surgery may be higher than the risk of no surgery.  In his case, I estimate the risk of bleeding of the aneurysm around 1 %/year.  I estimate the risk of a complication of aneurysm surgery around 3%.  I have given him the options of surgery for the aneurysm versus imaging follow-up in 1 year.  He prefers the latter.  I will schedule a follow-up MRA in 1 year to evaluate the aneurysm.  I spent more than 45 minutes with the review of the chart, personal review of his imaging studies, history and physical and consultation.

## 2023-12-31 ENCOUNTER — Inpatient Hospital Stay: Payer: Self-pay | Admitting: Adult Health

## 2023-12-31 ENCOUNTER — Encounter: Payer: Self-pay | Admitting: Adult Health

## 2023-12-31 NOTE — Progress Notes (Deleted)
 PATIENT: Frank Kim DOB: 1965/08/08  REASON FOR VISIT: follow up HISTORY FROM: patient PRIMARY NEUROLOGIST: Dr. Rosemarie  No chief complaint on file.    HISTORY OF PRESENT ILLNESS: Today   Frank Kim is a 58 y.o. male who has been followed in this office for ***. Returns today for follow-up.   Imaging:    FU: Labs: Carotid dopplers? ECHO: Discharge note Work? OSA?   HISTORY (copied from Hospital)Frank Kim is a 58 y.o. male with history of diabetes, hyperlipidemia, stroke 2 years ago admitted for headache, bilateral blurry vision with left eye pain and fatigue. No TNK given due to outside window.     Stroke:  left PCA infarct likely secondary to large vessel disease source CT head left PCA subacute infarct, right PCA chronic infarct CTA head and neck left PCA P2/P3 occlusion, some P3 reconstitution.  Right PCA multifocal stenosis.  Right V4 short segment occlusion.  Mild to moderate right siphon stenosis. MRI acute left PCA, old right PCA infarcts 2D Echo EF 60 to 65% LDL 79 HgbA1c 11.2 UDS negative Lovenox  for VTE prophylaxis No antithrombotic prior to admission, now on aspirin  81 mg daily and clopidogrel  75 mg daily.  DAPT for 3 months given left PCA occlusion and then aspirin  alone. Patient counseled to be compliant with his antithrombotic medications Ongoing aggressive stroke risk factor management Therapy recommendations: Pending Disposition: Pending   History of stroke Per patient, 2 years ago he had episode of blurry vision and headache when getting out of truck, did not see any doctor, had home rest and use the pain killer CT and MRI showed old right PCA infarct Not on antithrombotics at home   [facility] aneurysm CTA head and neck showed ACOM aneurysm 4 x 6 mm Asymptomatic Likely incidental finding Neuro IR Dr. Ray consulted, recommend outpatient follow-up   Diabetes HgbA1c 11.2 goal < 7.0 Uncontrolled CBG  monitoring SSI DM education and close PCP follow up   Hypertension Stable Avoid low BP Long term BP goal normotensive   Hyperlipidemia Home meds: Lipitor 20 LDL 79, goal < 70 Now on Lipitor 40 Continue statin at discharge   Other Stroke Risk Factors ETOH use, recommend no more than 2 drinks per day Obesity, Body mass index is 45.19 kg/m.     REVIEW OF SYSTEMS: Out of a complete 14 system review of symptoms, the patient complains only of the following symptoms, and all other reviewed systems are negative.  ALLERGIES: No Known Allergies  HOME MEDICATIONS: Outpatient Medications Prior to Visit  Medication Sig Dispense Refill   amLODipine (NORVASC) 5 MG tablet Take 5 mg by mouth daily.     aspirin  EC 81 MG tablet Take 1 tablet (81 mg total) by mouth daily. Swallow whole. 30 tablet 12   atorvastatin  (LIPITOR) 40 MG tablet Take 1 tablet (40 mg total) by mouth daily. 30 tablet 2   Blood Glucose Monitoring Suppl DEVI 1 each by Does not apply route 3 (three) times daily. May dispense any manufacturer covered by patient's insurance. 1 each 0   clopidogrel  (PLAVIX ) 75 MG tablet Take 1 tablet (75 mg total) by mouth daily. 30 tablet 2   feeding supplement (ENSURE PLUS HIGH PROTEIN) LIQD Take 237 mLs by mouth 2 (two) times daily between meals. 14220 mL 2   Glucose Blood (BLOOD GLUCOSE TEST STRIPS) STRP 1 each by Does not apply route 3 (three) times daily. Use as directed to check blood sugar. May dispense any manufacturer covered by patient's  insurance and fits patient's device. 100 strip 0   insulin  aspart protamine - aspart (NOVOLOG  70/30 MIX) (70-30) 100 UNIT/ML FlexPen Inject 15 Units into the skin 2 (two) times daily with a meal. 15 mL 0   Insulin  Pen Needle (PEN NEEDLES) 31G X 5 MM MISC 1 each by Does not apply route 3 (three) times daily. May dispense any manufacturer covered by patient's insurance. 100 each 0   JARDIANCE 10 MG TABS tablet Take 10 mg by mouth daily.     Lancet Device  MISC 1 each by Does not apply route 3 (three) times daily. May dispense any manufacturer covered by patient's insurance. 1 each 0   Lancets MISC 1 each by Does not apply route 3 (three) times daily. Use as directed to check blood sugar. May dispense any manufacturer covered by patient's insurance and fits patient's device. 100 each 0   lisinopril-hydrochlorothiazide (ZESTORETIC) 20-25 MG tablet Take 1 tablet by mouth daily.     metFORMIN (GLUCOPHAGE) 1000 MG tablet Take 1,000 mg by mouth 2 (two) times daily.     No facility-administered medications prior to visit.    PAST MEDICAL HISTORY: Past Medical History:  Diagnosis Date   Diabetes mellitus without complication (HCC)     PAST SURGICAL HISTORY: No past surgical history on file.  FAMILY HISTORY: No family history on file.  SOCIAL HISTORY: Social History   Socioeconomic History   Marital status: Single    Spouse name: Not on file   Number of children: Not on file   Years of education: Not on file   Highest education level: Not on file  Occupational History   Not on file  Tobacco Use   Smoking status: Never   Smokeless tobacco: Not on file  Substance and Sexual Activity   Alcohol use: Yes    Comment: 3x/ week   Drug use: No   Sexual activity: Not on file  Other Topics Concern   Not on file  Social History Narrative   Not on file   Social Drivers of Health   Financial Resource Strain: Not on file  Food Insecurity: No Food Insecurity (12/04/2023)   Hunger Vital Sign    Worried About Running Out of Food in the Last Year: Never true    Ran Out of Food in the Last Year: Never true  Transportation Needs: Unmet Transportation Needs (12/04/2023)   PRAPARE - Administrator, Civil Service (Medical): Yes    Lack of Transportation (Non-Medical): Yes  Physical Activity: Not on file  Stress: Not on file  Social Connections: Not on file  Intimate Partner Violence: Not At Risk (12/04/2023)   Humiliation, Afraid,  Rape, and Kick questionnaire    Fear of Current or Ex-Partner: No    Emotionally Abused: No    Physically Abused: No    Sexually Abused: No      PHYSICAL EXAM  There were no vitals filed for this visit. There is no height or weight on file to calculate BMI.  Generalized: Well developed, in no acute distress   Neurological examination  Mentation: Alert oriented to time, place, history taking. Follows all commands speech and language fluent Cranial nerve II-XII: Pupils were equal round reactive to light. Extraocular movements were full, visual field were full on confrontational test. Facial sensation and strength were normal. Uvula tongue midline. Head turning and shoulder shrug  were normal and symmetric. Motor: The motor testing reveals 5 over 5 strength of all 4 extremities. Good  symmetric motor tone is noted throughout.  Sensory: Sensory testing is intact to soft touch on all 4 extremities. No evidence of extinction is noted.  Coordination: Cerebellar testing reveals good finger-nose-finger and heel-to-shin bilaterally.  Gait and station: Gait is normal. Tandem gait is normal. Romberg is negative. No drift is seen.  Reflexes: Deep tendon reflexes are symmetric and normal bilaterally.   DIAGNOSTIC DATA (LABS, IMAGING, TESTING) - I reviewed patient records, labs, notes, testing and imaging myself where available.  Lab Results  Component Value Date   WBC 6.0 12/05/2023   HGB 13.7 12/05/2023   HCT 42.7 12/05/2023   MCV 82.4 12/05/2023   PLT 228 12/05/2023      Component Value Date/Time   NA 136 12/05/2023 0526   K 3.6 12/05/2023 0526   CL 101 12/05/2023 0526   CO2 25 12/05/2023 0526   GLUCOSE 192 (H) 12/05/2023 0526   BUN 15 12/05/2023 0526   CREATININE 0.86 12/05/2023 0526   CALCIUM  8.9 12/05/2023 0526   PROT 7.4 12/03/2023 1544   ALBUMIN 3.7 12/03/2023 1544   AST 31 12/03/2023 1544   ALT 20 12/03/2023 1544   ALKPHOS 105 12/03/2023 1544   BILITOT 1.0 12/03/2023 1544    GFRNONAA >60 12/05/2023 0526   Lab Results  Component Value Date   CHOL 153 12/04/2023   HDL 49 12/04/2023   LDLCALC 79 12/04/2023   TRIG 127 12/04/2023   CHOLHDL 3.1 12/04/2023   Lab Results  Component Value Date   HGBA1C 11.2 (H) 12/03/2023   No results found for: VITAMINB12 No results found for: TSH    ASSESSMENT AND PLAN 58 y.o. year old male  has a past medical history of Diabetes mellitus without complication (HCC). here with ***     Continue {anticoagulants:31417}  and ***  for secondary stroke prevention.   Discussed secondary stroke prevention measures and importance of close PCP follow up for aggressive stroke risk factor management. I have gone over the pathophysiology of stroke, warning signs and symptoms, risk factors and their management in some detail with instructions to go to the closest emergency room for symptoms of concern. HTN: BP goal <130/90.  Stable on *** per PCP HLD: LDL goal <70. Recent LDL ***.  DMII: A1c goal<7.0. Recent A1c ***.  Encouraged patient to monitor diet and encouraged exercise FU with our office ***  No orders of the defined types were placed in this encounter.  No orders of the defined types were placed in this encounter.     Duwaine Russell, MSN, NP-C 12/31/2023, 9:25 AM [facility] [address] [place], [place] [address] [phone]

## 2024-01-05 ENCOUNTER — Ambulatory Visit (HOSPITAL_COMMUNITY): Payer: Self-pay | Attending: Neuroradiology

## 2024-01-12 ENCOUNTER — Ambulatory Visit: Payer: Self-pay | Admitting: Physical Therapy

## 2024-01-12 ENCOUNTER — Ambulatory Visit: Payer: Self-pay | Admitting: Occupational Therapy

## 2024-01-12 NOTE — Therapy (Deleted)
 OUTPATIENT PHYSICAL THERAPY NEURO EVALUATION   Patient Name: Frank Kim MRN: [id number] DOB:October 14, 1965, 58 y.o., male Today's Date: 01/12/2024   PCP: *** REFERRING PROVIDER: Cherlyn Labella, MD  END OF SESSION:   Past Medical History:  Diagnosis Date   Diabetes mellitus without complication (HCC)    No past surgical history on file. 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  Rationale for Evaluation and Treatment: Rehabilitation  SUBJECTIVE:                                                                                                                                                                                             SUBJECTIVE STATEMENT: *** Pt accompanied by: {accompnied:27141}  PERTINENT HISTORY: ***Hospitalization 12/03/23-12/06/23; presented with HA, blurry vision (L>R eye), L eye pain, and L sided weakness.  Imaging showing acute L PCA territory  infarcts; remote R PCA territory infarcts  CTA head/neck showing L PCA occlusion (P2/P3 segment junction; some P3 reconstitution; saccular aneurysm ACA 4 x 6 mm--recommendations for OP follow-up).  Extensive visual field cut noted.  PMH includes htn, HLD, DM II.  PAIN:  Are you having pain? {OPRCPAIN:27236}  PRECAUTIONS: {Therapy precautions:24002}  RED FLAGS: {PT Red Flags:29287}   WEIGHT BEARING RESTRICTIONS: {Yes ***/No:24003}  FALLS: Has patient fallen in last 6 months?  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  PATIENT GOALS: ***  OBJECTIVE:  Note: Objective measures were completed at Evaluation unless otherwise noted.  DIAGNOSTIC FINDINGS:  MRI Brain 12/04/23: 1. Acute left PCA territory infarcts. 2. Remote right PCA territory infarcts.  CT Angio Head Neck 12/04/23: 1. Abnormal intracranial CTA: - Left PCA Occlusion at the P2/P3 segment junction. Some P3 reconstitution. 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 Dominant left and diminutive or absent right A1 segments. Recommend Neuro endovascular follow-up. - short segment Occlusion of the non dominant Right Vertebral Artery V4 segment just distal to patent right PICA origin. - Mild to moderate stenosis of the more dominant Left Vertebral Artery as it  crosses into the skull base. - multifocal Moderate stenoses of the right PCA. 2. Ectatic, dominant Left Carotid Artery in the neck and through the ICA siphon. Mild superimposed atherosclerosis, no left carotid stenosis. 3. Normal caliber Right Carotid. Mild to moderate Right ICA siphon calcified plaque and mild distal Right siphon stenosis. 4. Mild tortuosity and atherosclerosis at the distal aortic arch.  COGNITION: Overall cognitive status: {cognition:24006}   SENSATION: {sensation:27233}  COORDINATION: ***  EDEMA:  {edema:24020}  MUSCLE TONE: {LE tone:25568}  MUSCLE LENGTH: Hamstrings: Right *** deg; Left *** deg Debby test: Right *** deg; Left *** deg  DTRs:  {DTR SITE:24025}  POSTURE: {posture:25561}  LOWER EXTREMITY ROM:     {AROM/PROM:27142}  Right Eval Left Eval  Hip flexion    Hip extension    Hip abduction    Hip adduction    Hip internal rotation    Hip external  rotation    Knee flexion    Knee extension    Ankle dorsiflexion    Ankle plantarflexion    Ankle inversion    Ankle eversion     (Blank rows = not tested)  LOWER EXTREMITY MMT:    MMT Right Eval Left Eval  Hip flexion    Hip extension    Hip abduction    Hip adduction    Hip internal rotation    Hip external rotation    Knee flexion    Knee extension    Ankle dorsiflexion    Ankle plantarflexion    Ankle inversion    Ankle eversion    (Blank rows = not tested)  BED MOBILITY:  {bed mobility:32615:p}  TRANSFERS: {transfers eval:32620}  RAMP:  {ramp eval:32616}  CURB:  {curb eval:32617}  STAIRS: {stairs eval:32618} GAIT: Findings: {GaitneuroPT:32644::Distance walked: ***,Comments: ***}  FUNCTIONAL TESTS:  {Functional tests:24029}  PATIENT SURVEYS:  {rehab surveys:24030}                                                                                                                              TREATMENT DATE: ***    PATIENT EDUCATION: Education details: *** Person educated: {Person educated:25204} Education method: {Education Method:25205} Education comprehension: {Education Comprehension:25206}  HOME EXERCISE PROGRAM: ***  GOALS: Goals reviewed with patient? Yes  SHORT TERM GOALS: Target date: ***  *** Baseline: Goal status: INITIAL  2.  *** Baseline:  Goal status: INITIAL  3.  *** Baseline:  Goal status: INITIAL  4.  *** Baseline:  Goal status: INITIAL  5.  *** Baseline:  Goal status: INITIAL  6.  *** Baseline:  Goal status: INITIAL  LONG TERM GOALS: Target date: ***  *** Baseline:  Goal status: INITIAL  2.  *** Baseline:  Goal status: INITIAL  3.  *** Baseline:  Goal status: INITIAL  4.  *** Baseline:  Goal status: INITIAL  5.  *** Baseline:  Goal status: INITIAL  6.  *** Baseline:  Goal status: INITIAL  ASSESSMENT:  CLINICAL IMPRESSION: Patient  is a *** y.o. *** who was seen today for  physical therapy evaluation and treatment for ***.   OBJECTIVE IMPAIRMENTS: {opptimpairments:25111}.   ACTIVITY LIMITATIONS: {activitylimitations:27494}  PARTICIPATION LIMITATIONS: {participationrestrictions:25113}  PERSONAL FACTORS: {Personal factors:25162} are also affecting patient's functional outcome.   REHAB POTENTIAL: {rehabpotential:25112}  CLINICAL DECISION MAKING: {clinical decision making:25114}  EVALUATION COMPLEXITY: {Evaluation complexity:25115}  PLAN:  PT FREQUENCY: {rehab frequency:25116}  PT DURATION: {rehab duration:25117}  PLANNED INTERVENTIONS: {rehab planned interventions:25118::97110-Therapeutic exercises,97530- Therapeutic [phone]- Neuromuscular re-education,97535- Self Rjmz,02859- Manual therapy,Patient/Family education}  PLAN FOR NEXT SESSION: ***   Damien Caulk, PT 01/12/2024, 7:53 AM

## 2024-01-15 ENCOUNTER — Telehealth: Payer: Self-pay | Admitting: Neuroradiology

## 2024-01-15 NOTE — Telephone Encounter (Signed)
 Called patient to see if he would like to add on insurance or give him estimate for upcoming visit

## 2024-01-16 ENCOUNTER — Ambulatory Visit: Payer: Self-pay | Admitting: Family Medicine

## 2024-01-20 NOTE — Therapy (Incomplete)
 OUTPATIENT OCCUPATIONAL THERAPY NEURO EVALUATION  Patient Name: Frank Kim MRN: [id number] DOB:March 02, 1966, 58 y.o., male Today's Date: 01/20/2024  PCP: *** REFERRING PROVIDER: Cherlyn Labella, MD   END OF SESSION:   Past Medical History:  Diagnosis Date   Diabetes mellitus without complication (HCC)    No past surgical history on file. 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  Rationale for Evaluation and Treatment: Rehabilitation  SUBJECTIVE:   SUBJECTIVE STATEMENT: *** Pt accompanied by: {accompnied:27141}  PERTINENT HISTORY: male presenting on 9/17 with headache, blurry vision, and L eye pain for 4 days. MRI with L PCA territory infarcts with remote R PCA infarcts. Also found with anterior cerebral aneurysm, medical history significant of hypertension, hyperlipidemia, and diabetes mellitus type 2 presents with headache and blurry vision.   PRECAUTIONS: {Therapy precautions:24002}  WEIGHT BEARING RESTRICTIONS: {Yes ***/No:24003}  PAIN:  Are you having pain? {OPRCPAIN:27236}  FALLS: Has patient fallen in last 6 months? {fallsyesno:27318}  LIVING ENVIRONMENT: Lives with: {[facility] lives with:25569::lives with their family} Lives in: {Lives in:25570} Stairs: {opstairs:27293} Has following equipment at home: {Assistive devices:23999}  PLOF: {PLOF:24004}  PATIENT GOALS: ***  OBJECTIVE:  Note: Objective measures were completed at Evaluation unless otherwise  noted.  HAND DOMINANCE: {MISC; OT HAND [phone]}  ADLs: Overall ADLs: *** Transfers/ambulation related to ADLs: Eating: *** Grooming: *** UB Dressing: *** LB Dressing: *** Toileting: *** Bathing: *** Tub Shower transfers: *** Equipment: {equipment:25573}  IADLs: Shopping: *** Light housekeeping: *** Meal Prep: *** Community mobility: *** Medication management: *** Financial management: *** Handwriting: {OTWRITTENEXPRESSION:25361}  MOBILITY STATUS: {OTMOBILITY:25360}  POSTURE COMMENTS:  {posture:25561} Sitting balance: {sitting balance:25483}  ACTIVITY TOLERANCE: Activity tolerance: ***  FUNCTIONAL OUTCOME MEASURES: {OTFUNCTIONALMEASURES:27238}  UPPER EXTREMITY ROM:    {AROM/PROM:27142} ROM Right eval Left eval  Shoulder flexion    Shoulder abduction    Shoulder adduction    Shoulder extension    Shoulder internal rotation    Shoulder external rotation    Elbow flexion    Elbow extension    Wrist flexion    Wrist extension    Wrist ulnar deviation    Wrist radial deviation    Wrist pronation    Wrist supination    (Blank rows = not tested)  UPPER EXTREMITY MMT:     MMT Right eval Left eval  Shoulder flexion    Shoulder abduction    Shoulder adduction    Shoulder extension    Shoulder internal rotation    Shoulder external rotation    Middle trapezius    Lower trapezius    Elbow flexion    Elbow extension    Wrist flexion    Wrist extension    Wrist ulnar deviation    Wrist radial deviation    Wrist pronation    Wrist supination    (Blank rows = not tested)  HAND FUNCTION: {handfunction:27230}  COORDINATION: {otcoordination:27237}  SENSATION: {sensation:27233}  EDEMA: ***  MUSCLE TONE: {UETONE:25567}  COGNITION: Overall cognitive status: {cognition:24006}  VISION: Subjective report: *** Baseline vision: {OTBASELINEVISION:25363} Visual history: {OTVISUALHISTORY:25364}  VISION  ASSESSMENT: {  visionassessment:27231}  Patient has difficulty with following activities due to following visual impairments: ***  PERCEPTION: {Perception:25564}  PRAXIS: {Praxis:25565}  OBSERVATIONS: ***                                                                                                                             TREATMENT DATE: ***         PATIENT EDUCATION: Education details: *** Person educated: {Person educated:25204} Education method: {Education Method:25205} Education comprehension: {Education Comprehension:25206}  HOME EXERCISE PROGRAM: ***   GOALS: Goals reviewed with patient? {yes/no:20286}  SHORT TERM GOALS: Target date: ***  *** Baseline: Goal status: INITIAL  2.  *** Baseline:  Goal status: INITIAL  3.  *** Baseline:  Goal status: INITIAL  4.  *** Baseline:  Goal status: INITIAL  5.  *** Baseline:  Goal status: INITIAL  6.  *** Baseline:  Goal status: INITIAL  LONG TERM GOALS: Target date: ***  *** Baseline:  Goal status: INITIAL  2.  *** Baseline:  Goal status: INITIAL  3.  *** Baseline:  Goal status: INITIAL  4.  *** Baseline:  Goal status: INITIAL  5.  *** Baseline:  Goal status: INITIAL  6.  *** Baseline:  Goal status: INITIAL  ASSESSMENT:  CLINICAL IMPRESSION: Patient is a *** y.o. *** who was seen today for occupational therapy evaluation for ***.   PERFORMANCE DEFICITS: in functional skills including {OT physical skills:25468}, cognitive skills including {OT cognitive skills:25469}, and psychosocial skills including {OT psychosocial skills:25470}.   IMPAIRMENTS: are limiting patient from {OT performance deficits:25471}.   CO-MORBIDITIES: {Comorbidities:25485} that affects occupational performance. Patient will benefit from skilled OT to address above impairments and improve overall function.  MODIFICATION OR ASSISTANCE TO COMPLETE EVALUATION: {OT modification:25474}  OT OCCUPATIONAL  PROFILE AND HISTORY: {OT PROFILE AND HISTORY:25484}  CLINICAL DECISION MAKING: {OT CDM:25475}  REHAB POTENTIAL: {rehabpotential:25112}  EVALUATION COMPLEXITY: {Evaluation complexity:25115}    PLAN:  OT FREQUENCY: {rehab frequency:25116}  OT DURATION: {rehab duration:25117}  PLANNED INTERVENTIONS: {OT Interventions:25467}  RECOMMENDED OTHER SERVICES: ***  CONSULTED AND AGREED WITH PLAN OF CARE: {ENR:74513}  PLAN FOR NEXT SESSION: ***   Burnard JINNY Roads, OT 01/20/2024, 11:16 AM

## 2024-01-22 ENCOUNTER — Ambulatory Visit: Payer: Self-pay | Admitting: Physical Therapy

## 2024-01-22 ENCOUNTER — Ambulatory Visit: Payer: Self-pay | Attending: Internal Medicine | Admitting: Occupational Therapy

## 2024-01-22 NOTE — Therapy (Incomplete)
 OUTPATIENT PHYSICAL THERAPY NEURO EVALUATION   Patient Name: Frank Kim MRN: [id number] DOB:[birth year], 58 y.o., male Today's Date: 01/22/2024   PCP: None  REFERRING PROVIDER: Cherlyn Labella, MD  END OF SESSION:   Past Medical History:  Diagnosis Date   Diabetes mellitus without complication (HCC)    No past surgical history on file. 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  Rationale for Evaluation and Treatment: Rehabilitation  SUBJECTIVE:                                                                                                                                                                                             SUBJECTIVE STATEMENT: *** Pt accompanied by: {accompnied:27141}  PERTINENT HISTORY: ***Hospitalization 12/03/23-12/06/23; presented with HA, blurry vision (L>R eye), L eye pain, and L sided weakness.  Imaging showing acute L PCA territory  infarcts; remote R PCA territory infarcts  CTA head/neck showing L PCA occlusion (P2/P3 segment junction; some P3 reconstitution; saccular aneurysm ACA 4 x 6 mm--recommendations for OP follow-up).  Extensive visual field cut noted.  PMH includes htn, HLD, DM II.  PAIN:  Are you having pain? {OPRCPAIN:27236}  PRECAUTIONS: {Therapy precautions:24002}  RED FLAGS: {PT Red Flags:29287}   WEIGHT BEARING RESTRICTIONS: {Yes ***/No:24003}  FALLS: Has patient fallen in last 6 months?  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  PATIENT GOALS: ***  OBJECTIVE:  Note: Objective measures were completed at Evaluation unless otherwise noted.  DIAGNOSTIC FINDINGS:  MRI Brain 12/04/23: 1. Acute left PCA territory infarcts. 2. Remote right PCA territory infarcts.  CT Angio Head Neck 12/04/23: 1. Abnormal intracranial CTA: - Left PCA Occlusion at the P2/P3 segment junction. Some P3 reconstitution. 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 Dominant left and diminutive or absent right A1 segments. Recommend Neuro endovascular follow-up. - short segment Occlusion of the non dominant Right Vertebral Artery V4 segment just distal to patent right PICA origin. - Mild to moderate stenosis of the more dominant Left Vertebral Artery as  it crosses into the skull base. - multifocal Moderate stenoses of the right PCA. 2. Ectatic, dominant Left Carotid Artery in the neck and through the ICA siphon. Mild superimposed atherosclerosis, no left carotid stenosis. 3. Normal caliber Right Carotid. Mild to moderate Right ICA siphon calcified plaque and mild distal Right siphon stenosis. 4. Mild tortuosity and atherosclerosis at the distal aortic arch.  COGNITION: Overall cognitive status: {cognition:24006}   SENSATION: {sensation:27233}  COORDINATION: ***  EDEMA:  {edema:24020}  MUSCLE TONE: {LE tone:25568}  MUSCLE LENGTH: Hamstrings: Right *** deg; Left *** deg Debby test: Right *** deg; Left *** deg  DTRs:  {DTR SITE:24025}  POSTURE: {posture:25561}  LOWER EXTREMITY ROM:     {AROM/PROM:27142}  Right Eval Left Eval  Hip flexion    Hip extension    Hip abduction    Hip adduction    Hip internal rotation    Hip external  rotation    Knee flexion    Knee extension    Ankle dorsiflexion    Ankle plantarflexion    Ankle inversion    Ankle eversion     (Blank rows = not tested)  LOWER EXTREMITY MMT:    MMT Right Eval Left Eval  Hip flexion    Hip extension    Hip abduction    Hip adduction    Hip internal rotation    Hip external rotation    Knee flexion    Knee extension    Ankle dorsiflexion    Ankle plantarflexion    Ankle inversion    Ankle eversion    (Blank rows = not tested)  BED MOBILITY:  {bed mobility:32615:p}  TRANSFERS: {transfers eval:32620}  RAMP:  {ramp eval:32616}  CURB:  {curb eval:32617}  STAIRS: {stairs eval:32618} GAIT: Findings: {GaitneuroPT:32644::Distance walked: ***,Comments: ***}  FUNCTIONAL TESTS:  {Functional tests:24029}  PATIENT SURVEYS:  {rehab surveys:24030}                                                                                                                              TREATMENT DATE: ***    PATIENT EDUCATION: Education details: *** Person educated: {Person educated:25204} Education method: {Education Method:25205} Education comprehension: {Education Comprehension:25206}  HOME EXERCISE PROGRAM: ***  GOALS: Goals reviewed with patient? Yes  SHORT TERM GOALS: Target date: ***  *** Baseline: Goal status: INITIAL  2.  *** Baseline:  Goal status: INITIAL  3.  *** Baseline:  Goal status: INITIAL  4.  *** Baseline:  Goal status: INITIAL  5.  *** Baseline:  Goal status: INITIAL  6.  *** Baseline:  Goal status: INITIAL  LONG TERM GOALS: Target date: ***  *** Baseline:  Goal status: INITIAL  2.  *** Baseline:  Goal status: INITIAL  3.  *** Baseline:  Goal status: INITIAL  4.  *** Baseline:  Goal status: INITIAL  5.  *** Baseline:  Goal status: INITIAL  6.  *** Baseline:  Goal status: INITIAL  ASSESSMENT:  CLINICAL IMPRESSION:  Patient is a *** year old male referred to Neuro OPPT  for CVA.   Pt's PMH is significant for: DM II, HTN, HLD. The following deficits were present during the exam: ***. Based on ***, pt is an incr risk for falls. Pt would benefit from skilled PT to address these impairments and functional limitations to maximize functional mobility independence   OBJECTIVE IMPAIRMENTS: {opptimpairments:25111}.   ACTIVITY LIMITATIONS: {activitylimitations:27494}  PARTICIPATION LIMITATIONS: {participationrestrictions:25113}  PERSONAL FACTORS: {Personal factors:25162} are also affecting patient's functional outcome.   REHAB POTENTIAL: {rehabpotential:25112}  CLINICAL DECISION MAKING: {clinical decision making:25114}  EVALUATION COMPLEXITY: {Evaluation complexity:25115}  PLAN:  PT FREQUENCY: {rehab frequency:25116}  PT DURATION: {rehab duration:25117}  PLANNED INTERVENTIONS: {rehab planned interventions:25118::97110-Therapeutic exercises,97530- Therapeutic [phone]- Neuromuscular re-education,97535- Self Rjmz,02859- Manual therapy,Patient/Family education}  PLAN FOR NEXT SESSION: ***   Kaithlyn Teagle E Acxel Dingee, PT, DPT 01/22/2024, 7:45 AM

## 2024-01-23 ENCOUNTER — Ambulatory Visit: Payer: Self-pay | Admitting: Family Medicine

## 2024-12-16 ENCOUNTER — Ambulatory Visit: Payer: Self-pay | Admitting: Neuroradiology
